# Patient Record
Sex: Male | Born: 1955 | Race: Black or African American | Hispanic: No | Marital: Married | Smoking: Current every day smoker
Health system: Southern US, Community
[De-identification: ages and names within clinical notes are randomized; demographics above are authoritative.]

## PROBLEM LIST (undated history)

## (undated) DIAGNOSIS — E119 Type 2 diabetes mellitus without complications: Secondary | ICD-10-CM

## (undated) DIAGNOSIS — I10 Essential (primary) hypertension: Secondary | ICD-10-CM

## (undated) DIAGNOSIS — I2510 Atherosclerotic heart disease of native coronary artery without angina pectoris: Secondary | ICD-10-CM

## (undated) DIAGNOSIS — E669 Obesity, unspecified: Secondary | ICD-10-CM

## (undated) DIAGNOSIS — G4730 Sleep apnea, unspecified: Secondary | ICD-10-CM

## (undated) HISTORY — DX: Obesity, unspecified: E66.9

## (undated) HISTORY — DX: Atherosclerotic heart disease of native coronary artery without angina pectoris: I25.10

## (undated) HISTORY — DX: Sleep apnea, unspecified: G47.30

---

## 2000-06-04 ENCOUNTER — Encounter: Payer: Self-pay | Admitting: Family Medicine

## 2000-06-04 ENCOUNTER — Ambulatory Visit (HOSPITAL_COMMUNITY): Admission: RE | Admit: 2000-06-04 | Discharge: 2000-06-04 | Payer: Self-pay | Admitting: Family Medicine

## 2005-10-30 ENCOUNTER — Ambulatory Visit: Payer: Self-pay | Admitting: Gastroenterology

## 2008-07-15 ENCOUNTER — Emergency Department (HOSPITAL_COMMUNITY): Admission: EM | Admit: 2008-07-15 | Discharge: 2008-07-15 | Payer: Self-pay | Admitting: Emergency Medicine

## 2008-08-27 HISTORY — PX: CORONARY ARTERY BYPASS GRAFT: SHX141

## 2008-11-07 ENCOUNTER — Emergency Department (HOSPITAL_COMMUNITY): Admission: EM | Admit: 2008-11-07 | Discharge: 2008-11-07 | Payer: Self-pay | Admitting: Emergency Medicine

## 2009-03-29 ENCOUNTER — Encounter (INDEPENDENT_AMBULATORY_CARE_PROVIDER_SITE_OTHER): Payer: Self-pay | Admitting: Cardiovascular Disease

## 2009-03-29 ENCOUNTER — Ambulatory Visit (HOSPITAL_COMMUNITY): Admission: RE | Admit: 2009-03-29 | Discharge: 2009-03-29 | Payer: Self-pay | Admitting: Cardiovascular Disease

## 2009-03-29 ENCOUNTER — Ambulatory Visit: Payer: Self-pay | Admitting: Surgery

## 2009-03-30 ENCOUNTER — Ambulatory Visit: Payer: Self-pay | Admitting: Cardiothoracic Surgery

## 2009-04-05 ENCOUNTER — Inpatient Hospital Stay (HOSPITAL_COMMUNITY): Admission: RE | Admit: 2009-04-05 | Discharge: 2009-04-10 | Payer: Self-pay | Admitting: Cardiothoracic Surgery

## 2009-04-05 ENCOUNTER — Ambulatory Visit: Payer: Self-pay | Admitting: Cardiothoracic Surgery

## 2009-04-13 ENCOUNTER — Encounter: Admission: RE | Admit: 2009-04-13 | Discharge: 2009-04-14 | Payer: Self-pay | Admitting: Cardiothoracic Surgery

## 2009-04-28 ENCOUNTER — Ambulatory Visit: Payer: Self-pay | Admitting: Cardiothoracic Surgery

## 2009-04-28 ENCOUNTER — Encounter: Admission: RE | Admit: 2009-04-28 | Discharge: 2009-04-28 | Payer: Self-pay | Admitting: Cardiothoracic Surgery

## 2009-05-05 ENCOUNTER — Encounter (HOSPITAL_COMMUNITY): Admission: RE | Admit: 2009-05-05 | Discharge: 2009-08-03 | Payer: Self-pay | Admitting: Cardiovascular Disease

## 2010-01-06 IMAGING — CR DG CHEST 2V
2 series · 2 of 2 positions shown · non-contrast
Comparison: Chest radiograph performed 11/07/2008

CLINICAL DATA: Preoperative radiograph for CABG; left arm and chest
tightness.

CHEST - 2 VIEW

[view not recorded (1 of 2)]
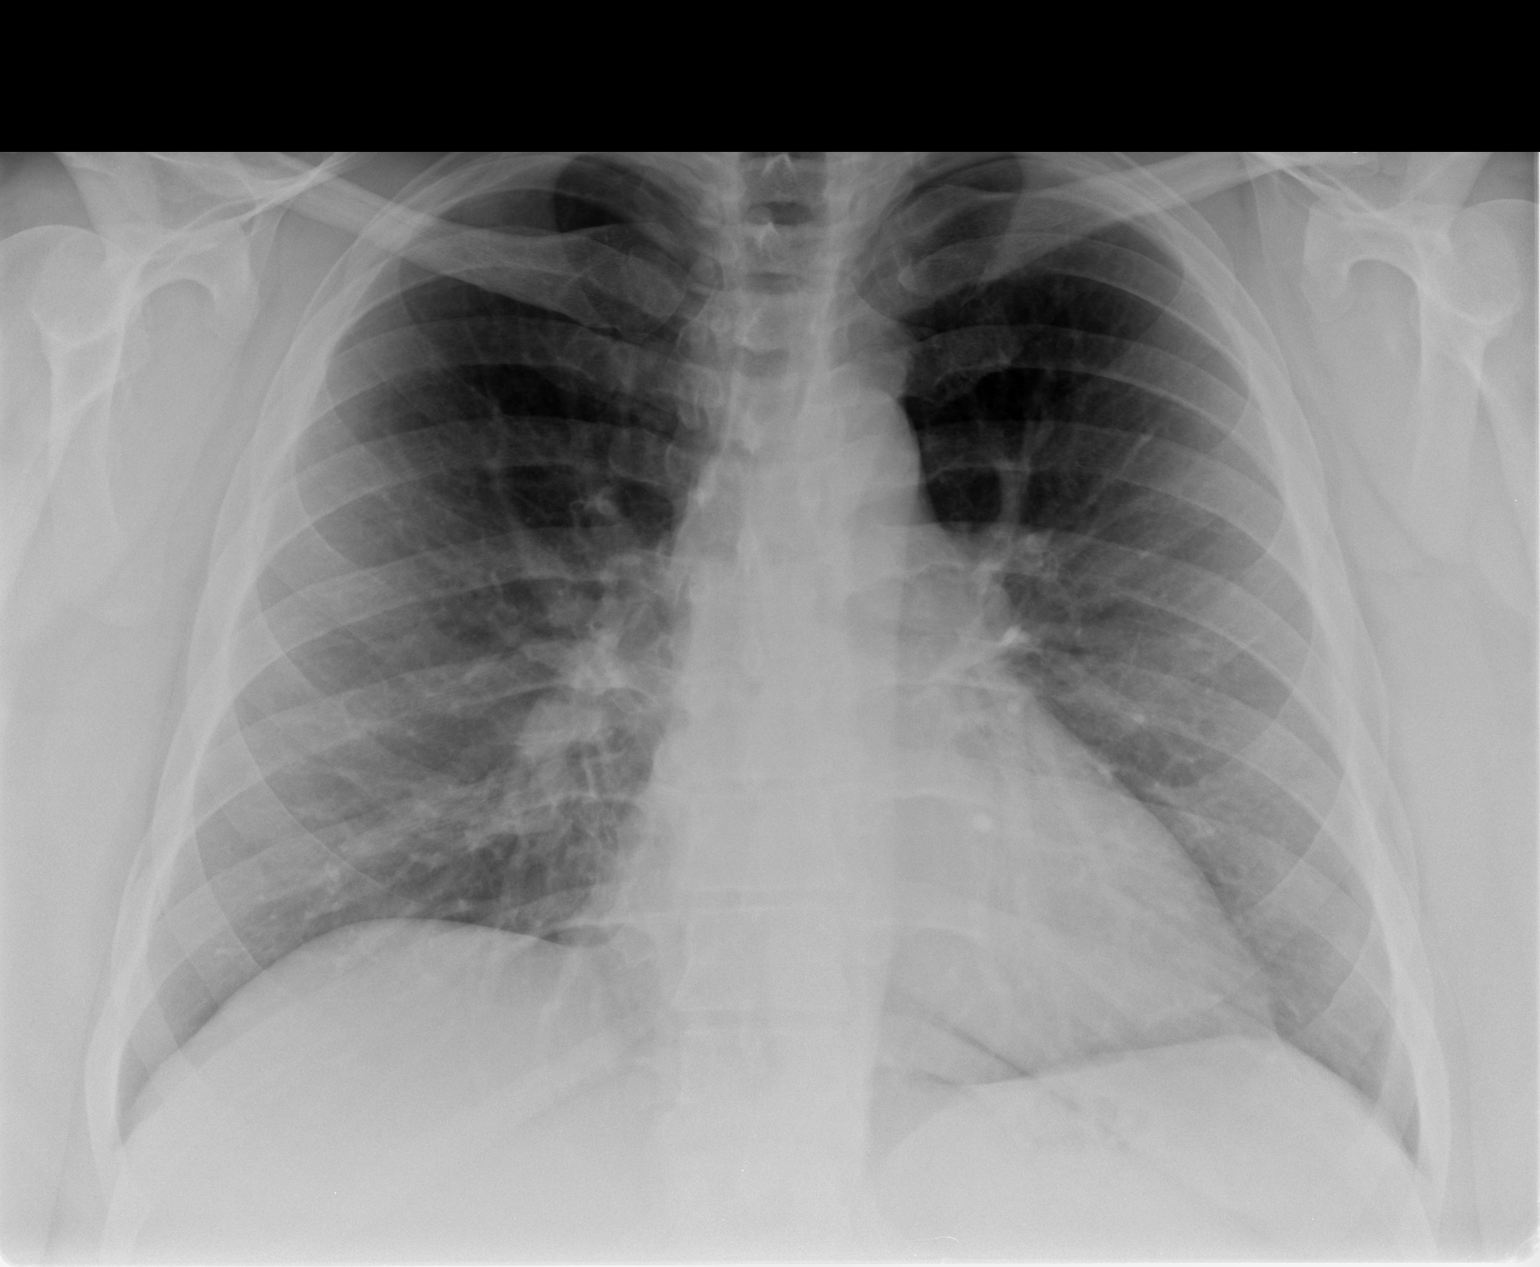

[view not recorded (2 of 2)]
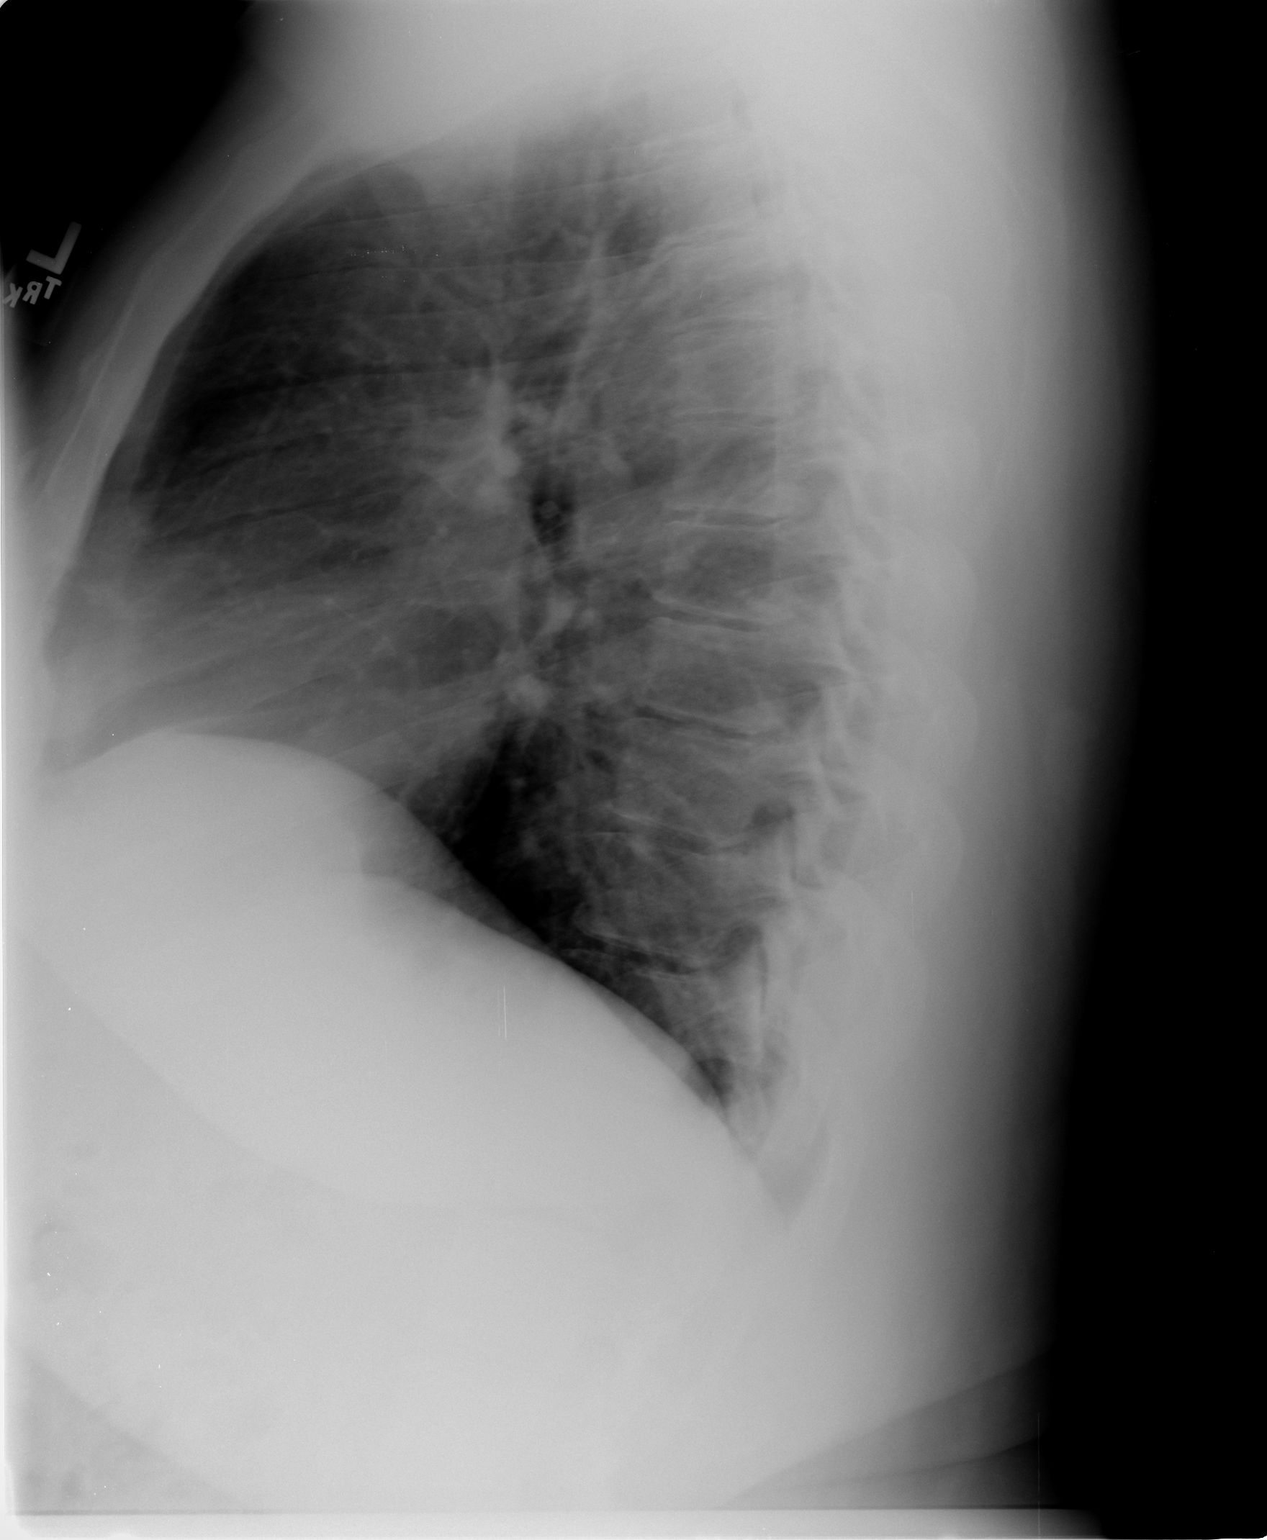

[2 of 2 positions shown; findings below may reference images not displayed]

FINDINGS: The lungs are well-aerated and clear.  There is no
evidence of focal opacification, pleural effusion or pneumothorax.

The heart is normal in size; the mediastinal contour is within
normal limits.  No acute osseous abnormalities are seen.
IMPRESSION: No acute cardiopulmonary process seen.

## 2010-02-02 IMAGING — CR DG CHEST 2V
2 series · 2 of 2 positions shown · non-contrast
Comparison: Chest x-ray of 04/08/2009

CLINICAL DATA: Chest pain, CABG in March 2009

CHEST - 2 VIEW

[w chest pa]
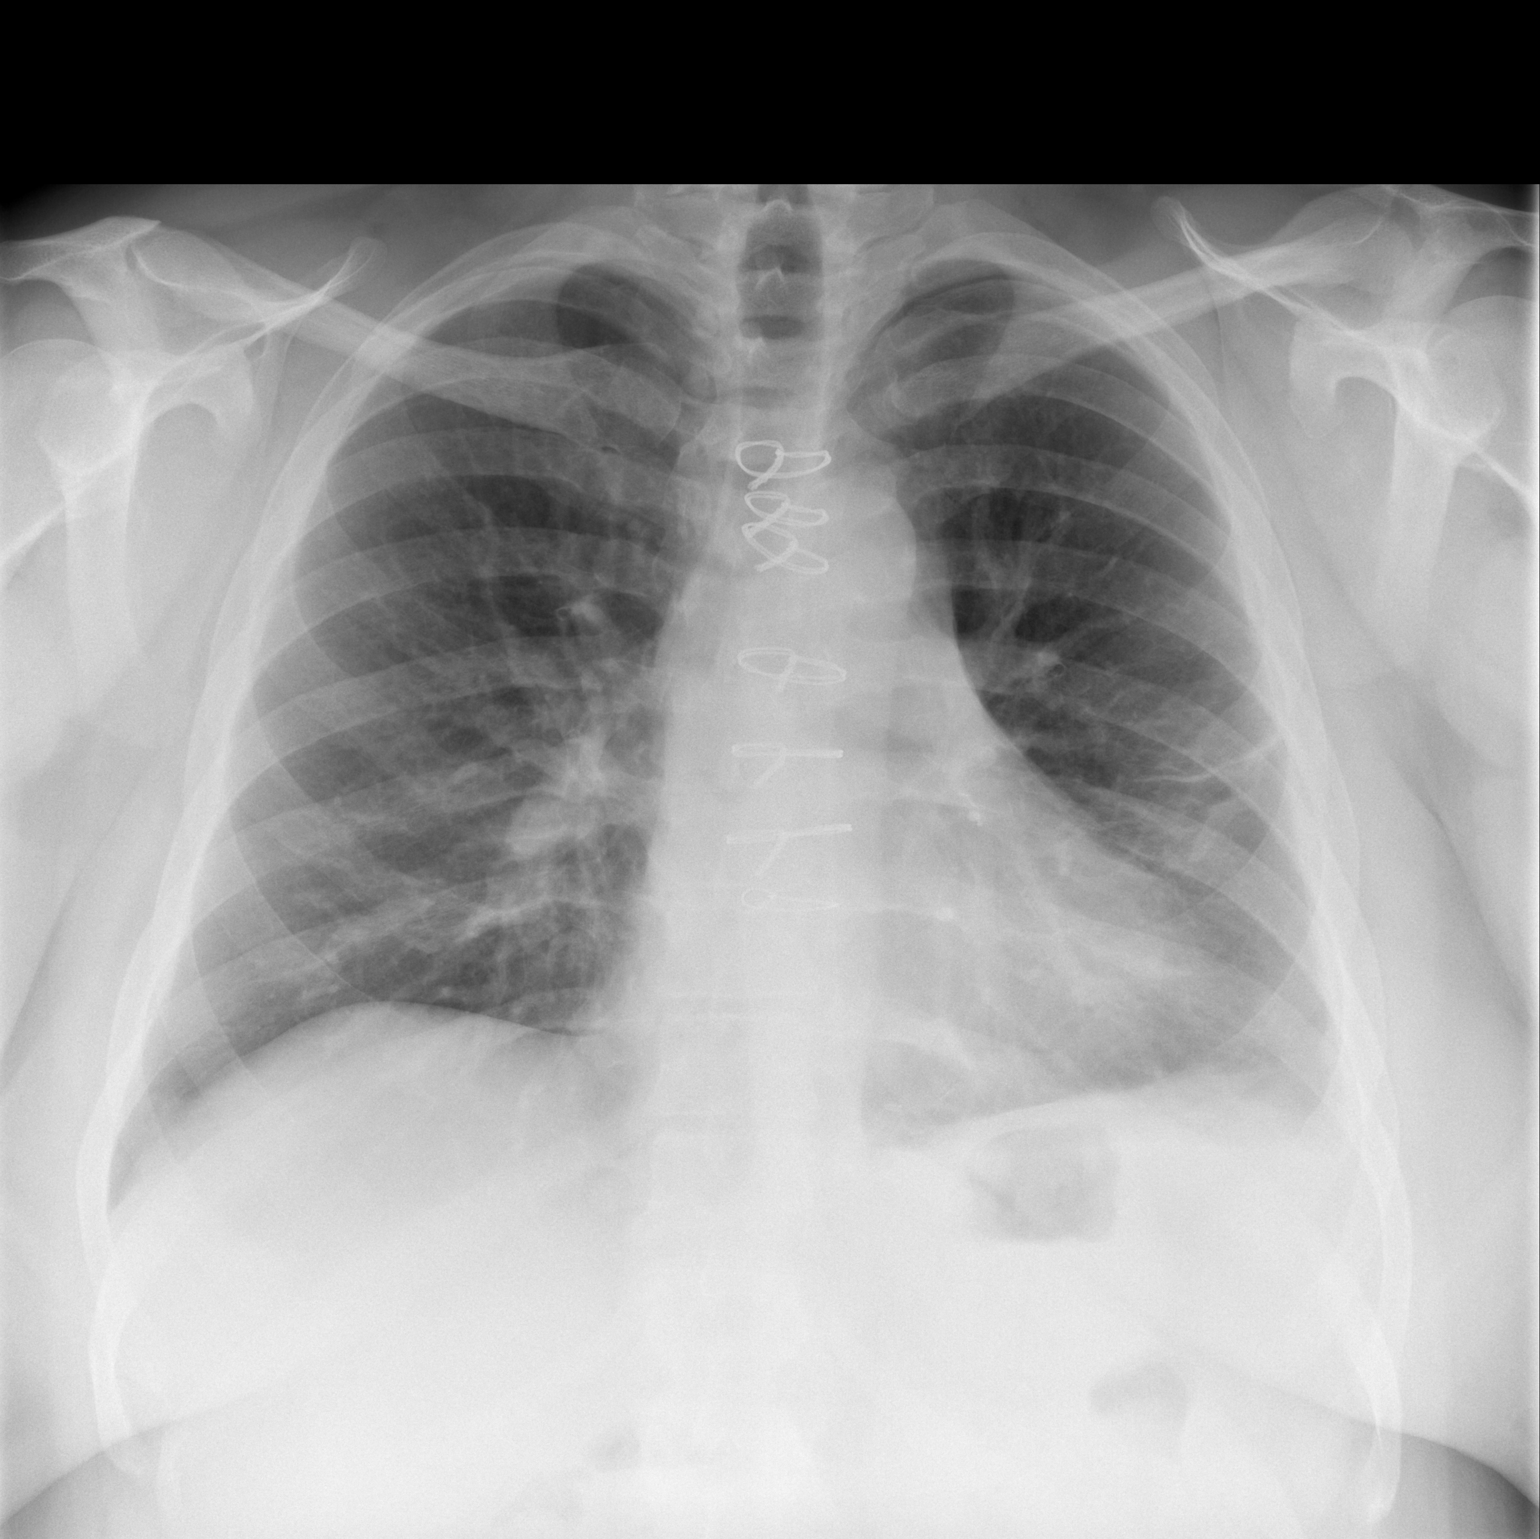

[w chest lat]
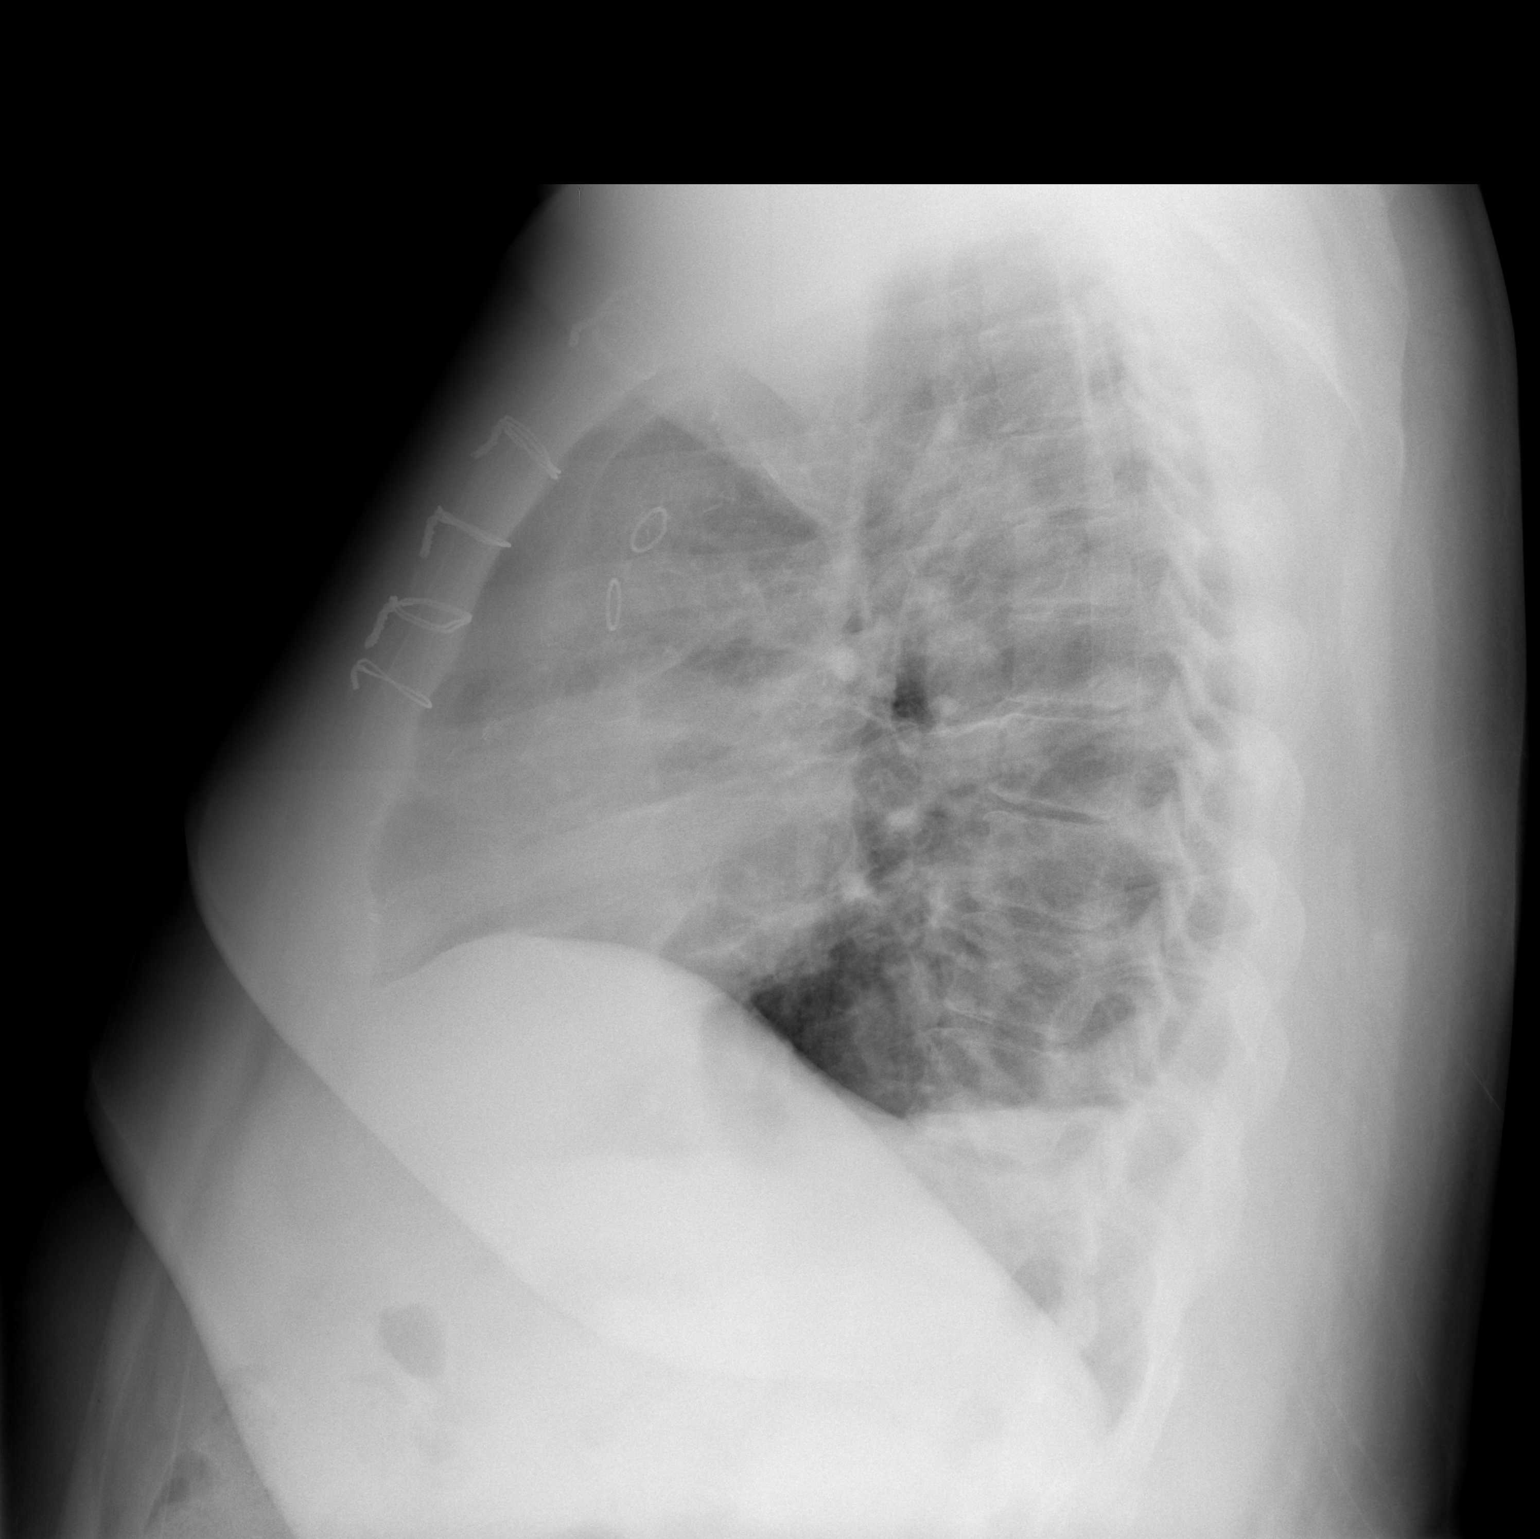

[2 of 2 positions shown; findings below may reference images not displayed]

FINDINGS: Aeration has improved with decrease in basilar
atelectasis.  A small left effusion remains.  Cardiomegaly is
stable.  No bony abnormality is seen.
IMPRESSION: Improved aeration.  Small left pleural effusion remains.

## 2010-12-01 LAB — GLUCOSE, CAPILLARY
Glucose-Capillary: 113 mg/dL — ABNORMAL HIGH (ref 70–99)
Glucose-Capillary: 72 mg/dL (ref 70–99)

## 2010-12-02 LAB — PROTIME-INR
INR: 1.1 (ref 0.00–1.49)
INR: 1.4 (ref 0.00–1.49)
Prothrombin Time: 14 seconds (ref 11.6–15.2)
Prothrombin Time: 16.8 seconds — ABNORMAL HIGH (ref 11.6–15.2)

## 2010-12-02 LAB — BASIC METABOLIC PANEL
BUN: 12 mg/dL (ref 6–23)
BUN: 20 mg/dL (ref 6–23)
BUN: 26 mg/dL — ABNORMAL HIGH (ref 6–23)
BUN: 27 mg/dL — ABNORMAL HIGH (ref 6–23)
BUN: 37 mg/dL — ABNORMAL HIGH (ref 6–23)
CO2: 25 mEq/L (ref 19–32)
CO2: 26 mEq/L (ref 19–32)
CO2: 26 mEq/L (ref 19–32)
CO2: 26 mEq/L (ref 19–32)
CO2: 27 mEq/L (ref 19–32)
Calcium: 7.9 mg/dL — ABNORMAL LOW (ref 8.4–10.5)
Calcium: 8.2 mg/dL — ABNORMAL LOW (ref 8.4–10.5)
Calcium: 8.6 mg/dL (ref 8.4–10.5)
Calcium: 8.6 mg/dL (ref 8.4–10.5)
Calcium: 8.7 mg/dL (ref 8.4–10.5)
Chloride: 100 mEq/L (ref 96–112)
Chloride: 105 mEq/L (ref 96–112)
Chloride: 98 mEq/L (ref 96–112)
Chloride: 98 mEq/L (ref 96–112)
Chloride: 98 mEq/L (ref 96–112)
Creatinine, Ser: 1.15 mg/dL (ref 0.4–1.5)
Creatinine, Ser: 1.17 mg/dL (ref 0.4–1.5)
Creatinine, Ser: 1.34 mg/dL (ref 0.4–1.5)
Creatinine, Ser: 1.5 mg/dL (ref 0.4–1.5)
Creatinine, Ser: 1.53 mg/dL — ABNORMAL HIGH (ref 0.4–1.5)
GFR calc Af Amer: 58 mL/min — ABNORMAL LOW (ref >60)
GFR calc Af Amer: 60 mL/min — ABNORMAL LOW (ref >60)
GFR calc Af Amer: >60 mL/min (ref >60)
GFR calc Af Amer: >60 mL/min (ref >60)
GFR calc Af Amer: >60 mL/min (ref >60)
GFR calc non Af Amer: 48 mL/min — ABNORMAL LOW (ref >60)
GFR calc non Af Amer: 49 mL/min — ABNORMAL LOW (ref >60)
GFR calc non Af Amer: 56 mL/min — ABNORMAL LOW (ref >60)
GFR calc non Af Amer: >60 mL/min (ref >60)
GFR calc non Af Amer: >60 mL/min (ref >60)
Glucose, Bld: 127 mg/dL — ABNORMAL HIGH (ref 70–99)
Glucose, Bld: 138 mg/dL — ABNORMAL HIGH (ref 70–99)
Glucose, Bld: 154 mg/dL — ABNORMAL HIGH (ref 70–99)
Glucose, Bld: 169 mg/dL — ABNORMAL HIGH (ref 70–99)
Glucose, Bld: 81 mg/dL (ref 70–99)
Potassium: 4.2 mEq/L (ref 3.5–5.1)
Potassium: 4.2 mEq/L (ref 3.5–5.1)
Potassium: 4.4 mEq/L (ref 3.5–5.1)
Potassium: 4.5 mEq/L (ref 3.5–5.1)
Potassium: 4.7 mEq/L (ref 3.5–5.1)
Sodium: 132 mEq/L — ABNORMAL LOW (ref 135–145)
Sodium: 132 mEq/L — ABNORMAL LOW (ref 135–145)
Sodium: 133 mEq/L — ABNORMAL LOW (ref 135–145)
Sodium: 133 mEq/L — ABNORMAL LOW (ref 135–145)
Sodium: 136 mEq/L (ref 135–145)

## 2010-12-02 LAB — GLUCOSE, CAPILLARY
Glucose-Capillary: 102 mg/dL — ABNORMAL HIGH (ref 70–99)
Glucose-Capillary: 102 mg/dL — ABNORMAL HIGH (ref 70–99)
Glucose-Capillary: 104 mg/dL — ABNORMAL HIGH (ref 70–99)
Glucose-Capillary: 106 mg/dL — ABNORMAL HIGH (ref 70–99)
Glucose-Capillary: 118 mg/dL — ABNORMAL HIGH (ref 70–99)
Glucose-Capillary: 119 mg/dL — ABNORMAL HIGH (ref 70–99)
Glucose-Capillary: 122 mg/dL — ABNORMAL HIGH (ref 70–99)
Glucose-Capillary: 125 mg/dL — ABNORMAL HIGH (ref 70–99)
Glucose-Capillary: 136 mg/dL — ABNORMAL HIGH (ref 70–99)
Glucose-Capillary: 137 mg/dL — ABNORMAL HIGH (ref 70–99)
Glucose-Capillary: 143 mg/dL — ABNORMAL HIGH (ref 70–99)
Glucose-Capillary: 143 mg/dL — ABNORMAL HIGH (ref 70–99)
Glucose-Capillary: 147 mg/dL — ABNORMAL HIGH (ref 70–99)
Glucose-Capillary: 150 mg/dL — ABNORMAL HIGH (ref 70–99)
Glucose-Capillary: 156 mg/dL — ABNORMAL HIGH (ref 70–99)
Glucose-Capillary: 157 mg/dL — ABNORMAL HIGH (ref 70–99)
Glucose-Capillary: 157 mg/dL — ABNORMAL HIGH (ref 70–99)
Glucose-Capillary: 158 mg/dL — ABNORMAL HIGH (ref 70–99)
Glucose-Capillary: 162 mg/dL — ABNORMAL HIGH (ref 70–99)
Glucose-Capillary: 169 mg/dL — ABNORMAL HIGH (ref 70–99)
Glucose-Capillary: 195 mg/dL — ABNORMAL HIGH (ref 70–99)
Glucose-Capillary: 219 mg/dL — ABNORMAL HIGH (ref 70–99)
Glucose-Capillary: 82 mg/dL (ref 70–99)
Glucose-Capillary: 90 mg/dL (ref 70–99)
Glucose-Capillary: 91 mg/dL (ref 70–99)
Glucose-Capillary: 91 mg/dL (ref 70–99)

## 2010-12-02 LAB — CBC
HCT: 24.7 % — ABNORMAL LOW (ref 39.0–52.0)
HCT: 25.1 % — ABNORMAL LOW (ref 39.0–52.0)
HCT: 26.4 % — ABNORMAL LOW (ref 39.0–52.0)
HCT: 27.4 % — ABNORMAL LOW (ref 39.0–52.0)
HCT: 28.9 % — ABNORMAL LOW (ref 39.0–52.0)
HCT: 28.9 % — ABNORMAL LOW (ref 39.0–52.0)
HCT: 32.8 % — ABNORMAL LOW (ref 39.0–52.0)
HCT: 34.3 % — ABNORMAL LOW (ref 39.0–52.0)
HCT: 37.8 % — ABNORMAL LOW (ref 39.0–52.0)
Hemoglobin: 10.6 g/dL — ABNORMAL LOW (ref 13.0–17.0)
Hemoglobin: 11.1 g/dL — ABNORMAL LOW (ref 13.0–17.0)
Hemoglobin: 12.4 g/dL — ABNORMAL LOW (ref 13.0–17.0)
Hemoglobin: 8 g/dL — ABNORMAL LOW (ref 13.0–17.0)
Hemoglobin: 8.4 g/dL — ABNORMAL LOW (ref 13.0–17.0)
Hemoglobin: 9 g/dL — ABNORMAL LOW (ref 13.0–17.0)
Hemoglobin: 9.1 g/dL — ABNORMAL LOW (ref 13.0–17.0)
Hemoglobin: 9.4 g/dL — ABNORMAL LOW (ref 13.0–17.0)
Hemoglobin: 9.5 g/dL — ABNORMAL LOW (ref 13.0–17.0)
MCHC: 32.2 g/dL (ref 30.0–36.0)
MCHC: 32.3 g/dL (ref 30.0–36.0)
MCHC: 32.5 g/dL (ref 30.0–36.0)
MCHC: 32.5 g/dL (ref 30.0–36.0)
MCHC: 32.8 g/dL (ref 30.0–36.0)
MCHC: 32.8 g/dL (ref 30.0–36.0)
MCHC: 33.2 g/dL (ref 30.0–36.0)
MCHC: 33.3 g/dL (ref 30.0–36.0)
MCHC: 33.9 g/dL (ref 30.0–36.0)
MCV: 78.8 fL (ref 78.0–100.0)
MCV: 79.5 fL (ref 78.0–100.0)
MCV: 80.2 fL (ref 78.0–100.0)
MCV: 80.3 fL (ref 78.0–100.0)
MCV: 80.3 fL (ref 78.0–100.0)
MCV: 80.3 fL (ref 78.0–100.0)
MCV: 80.6 fL (ref 78.0–100.0)
MCV: 80.8 fL (ref 78.0–100.0)
MCV: 80.9 fL (ref 78.0–100.0)
Platelets: 260 10*3/uL (ref 150–400)
Platelets: 264 10*3/uL (ref 150–400)
Platelets: 298 10*3/uL (ref 150–400)
Platelets: 304 10*3/uL (ref 150–400)
Platelets: 308 10*3/uL (ref 150–400)
Platelets: 319 10*3/uL (ref 150–400)
Platelets: 333 10*3/uL (ref 150–400)
Platelets: 433 10*3/uL — ABNORMAL HIGH (ref 150–400)
Platelets: 438 10*3/uL — ABNORMAL HIGH (ref 150–400)
RBC: 3.05 MIL/uL — ABNORMAL LOW (ref 4.22–5.81)
RBC: 3.13 MIL/uL — ABNORMAL LOW (ref 4.22–5.81)
RBC: 3.35 MIL/uL — ABNORMAL LOW (ref 4.22–5.81)
RBC: 3.45 MIL/uL — ABNORMAL LOW (ref 4.22–5.81)
RBC: 3.58 MIL/uL — ABNORMAL LOW (ref 4.22–5.81)
RBC: 3.6 MIL/uL — ABNORMAL LOW (ref 4.22–5.81)
RBC: 4.08 MIL/uL — ABNORMAL LOW (ref 4.22–5.81)
RBC: 4.28 MIL/uL (ref 4.22–5.81)
RBC: 4.69 MIL/uL (ref 4.22–5.81)
RDW: 16.1 % — ABNORMAL HIGH (ref 11.5–15.5)
RDW: 16.4 % — ABNORMAL HIGH (ref 11.5–15.5)
RDW: 16.4 % — ABNORMAL HIGH (ref 11.5–15.5)
RDW: 16.4 % — ABNORMAL HIGH (ref 11.5–15.5)
RDW: 16.5 % — ABNORMAL HIGH (ref 11.5–15.5)
RDW: 16.6 % — ABNORMAL HIGH (ref 11.5–15.5)
RDW: 16.7 % — ABNORMAL HIGH (ref 11.5–15.5)
RDW: 16.8 % — ABNORMAL HIGH (ref 11.5–15.5)
RDW: 16.9 % — ABNORMAL HIGH (ref 11.5–15.5)
WBC: 14.8 10*3/uL — ABNORMAL HIGH (ref 4.0–10.5)
WBC: 15.7 10*3/uL — ABNORMAL HIGH (ref 4.0–10.5)
WBC: 16.1 10*3/uL — ABNORMAL HIGH (ref 4.0–10.5)
WBC: 16.2 10*3/uL — ABNORMAL HIGH (ref 4.0–10.5)
WBC: 17.1 10*3/uL — ABNORMAL HIGH (ref 4.0–10.5)
WBC: 18.6 10*3/uL — ABNORMAL HIGH (ref 4.0–10.5)
WBC: 19.9 10*3/uL — ABNORMAL HIGH (ref 4.0–10.5)
WBC: 21.2 10*3/uL — ABNORMAL HIGH (ref 4.0–10.5)
WBC: 9.8 10*3/uL (ref 4.0–10.5)

## 2010-12-02 LAB — POCT I-STAT 3, ART BLOOD GAS (G3+)
Acid-Base Excess: 1 mmol/L (ref 0.0–2.0)
Acid-Base Excess: 3 mmol/L — ABNORMAL HIGH (ref 0.0–2.0)
Acid-Base Excess: 4 mmol/L — ABNORMAL HIGH (ref 0.0–2.0)
Acid-Base Excess: 4 mmol/L — ABNORMAL HIGH (ref 0.0–2.0)
Acid-Base Excess: 5 mmol/L — ABNORMAL HIGH (ref 0.0–2.0)
Acid-base deficit: 1 mmol/L (ref 0.0–2.0)
Bicarbonate: 25.1 mEq/L — ABNORMAL HIGH (ref 20.0–24.0)
Bicarbonate: 26.8 mEq/L — ABNORMAL HIGH (ref 20.0–24.0)
Bicarbonate: 28.5 mEq/L — ABNORMAL HIGH (ref 20.0–24.0)
Bicarbonate: 29.5 mEq/L — ABNORMAL HIGH (ref 20.0–24.0)
Bicarbonate: 29.5 mEq/L — ABNORMAL HIGH (ref 20.0–24.0)
Bicarbonate: 29.7 mEq/L — ABNORMAL HIGH (ref 20.0–24.0)
O2 Saturation: 100 %
O2 Saturation: 100 %
O2 Saturation: 100 %
O2 Saturation: 100 %
O2 Saturation: 91 %
O2 Saturation: 95 %
Patient temperature: 36.1
Patient temperature: 37.5
TCO2: 26 mmol/L (ref 0–100)
TCO2: 28 mmol/L (ref 0–100)
TCO2: 30 mmol/L (ref 0–100)
TCO2: 31 mmol/L (ref 0–100)
TCO2: 31 mmol/L (ref 0–100)
TCO2: 31 mmol/L (ref 0–100)
pCO2 arterial: 43 mmHg (ref 35.0–45.0)
pCO2 arterial: 43.5 mmHg (ref 35.0–45.0)
pCO2 arterial: 45.2 mmHg — ABNORMAL HIGH (ref 35.0–45.0)
pCO2 arterial: 46.2 mmHg — ABNORMAL HIGH (ref 35.0–45.0)
pCO2 arterial: 47 mmHg — ABNORMAL HIGH (ref 35.0–45.0)
pCO2 arterial: 47.6 mmHg — ABNORMAL HIGH (ref 35.0–45.0)
pH, Arterial: 7.354 (ref 7.350–7.450)
pH, Arterial: 7.391 (ref 7.350–7.450)
pH, Arterial: 7.399 (ref 7.350–7.450)
pH, Arterial: 7.4 (ref 7.350–7.450)
pH, Arterial: 7.413 (ref 7.350–7.450)
pH, Arterial: 7.441 (ref 7.350–7.450)
pO2, Arterial: 202 mmHg — ABNORMAL HIGH (ref 80.0–100.0)
pO2, Arterial: 272 mmHg — ABNORMAL HIGH (ref 80.0–100.0)
pO2, Arterial: 286 mmHg — ABNORMAL HIGH (ref 80.0–100.0)
pO2, Arterial: 340 mmHg — ABNORMAL HIGH (ref 80.0–100.0)
pO2, Arterial: 67 mmHg — ABNORMAL LOW (ref 80.0–100.0)
pO2, Arterial: 72 mmHg — ABNORMAL LOW (ref 80.0–100.0)

## 2010-12-02 LAB — POCT I-STAT, CHEM 8
BUN: 10 mg/dL (ref 6–23)
BUN: 17 mg/dL (ref 6–23)
Calcium, Ion: 1.12 mmol/L (ref 1.12–1.32)
Calcium, Ion: 1.13 mmol/L (ref 1.12–1.32)
Chloride: 103 mEq/L (ref 96–112)
Chloride: 108 mEq/L (ref 96–112)
Creatinine, Ser: 0.9 mg/dL (ref 0.4–1.5)
Creatinine, Ser: 1.1 mg/dL (ref 0.4–1.5)
Glucose, Bld: 167 mg/dL — ABNORMAL HIGH (ref 70–99)
Glucose, Bld: 175 mg/dL — ABNORMAL HIGH (ref 70–99)
HCT: 29 % — ABNORMAL LOW (ref 39.0–52.0)
HCT: 37 % — ABNORMAL LOW (ref 39.0–52.0)
Hemoglobin: 12.6 g/dL — ABNORMAL LOW (ref 13.0–17.0)
Hemoglobin: 9.9 g/dL — ABNORMAL LOW (ref 13.0–17.0)
Potassium: 4.1 mEq/L (ref 3.5–5.1)
Potassium: 4.9 mEq/L (ref 3.5–5.1)
Sodium: 137 mEq/L (ref 135–145)
Sodium: 140 mEq/L (ref 135–145)
TCO2: 21 mmol/L (ref 0–100)
TCO2: 23 mmol/L (ref 0–100)

## 2010-12-02 LAB — POCT I-STAT 4, (NA,K, GLUC, HGB,HCT)
Glucose, Bld: 117 mg/dL — ABNORMAL HIGH (ref 70–99)
Glucose, Bld: 139 mg/dL — ABNORMAL HIGH (ref 70–99)
Glucose, Bld: 144 mg/dL — ABNORMAL HIGH (ref 70–99)
Glucose, Bld: 145 mg/dL — ABNORMAL HIGH (ref 70–99)
Glucose, Bld: 147 mg/dL — ABNORMAL HIGH (ref 70–99)
Glucose, Bld: 149 mg/dL — ABNORMAL HIGH (ref 70–99)
Glucose, Bld: 155 mg/dL — ABNORMAL HIGH (ref 70–99)
Glucose, Bld: 156 mg/dL — ABNORMAL HIGH (ref 70–99)
Glucose, Bld: 156 mg/dL — ABNORMAL HIGH (ref 70–99)
HCT: 29 % — ABNORMAL LOW (ref 39.0–52.0)
HCT: 30 % — ABNORMAL LOW (ref 39.0–52.0)
HCT: 30 % — ABNORMAL LOW (ref 39.0–52.0)
HCT: 30 % — ABNORMAL LOW (ref 39.0–52.0)
HCT: 31 % — ABNORMAL LOW (ref 39.0–52.0)
HCT: 31 % — ABNORMAL LOW (ref 39.0–52.0)
HCT: 36 % — ABNORMAL LOW (ref 39.0–52.0)
HCT: 37 % — ABNORMAL LOW (ref 39.0–52.0)
HCT: 39 % (ref 39.0–52.0)
Hemoglobin: 10.2 g/dL — ABNORMAL LOW (ref 13.0–17.0)
Hemoglobin: 10.2 g/dL — ABNORMAL LOW (ref 13.0–17.0)
Hemoglobin: 10.2 g/dL — ABNORMAL LOW (ref 13.0–17.0)
Hemoglobin: 10.5 g/dL — ABNORMAL LOW (ref 13.0–17.0)
Hemoglobin: 10.5 g/dL — ABNORMAL LOW (ref 13.0–17.0)
Hemoglobin: 12.2 g/dL — ABNORMAL LOW (ref 13.0–17.0)
Hemoglobin: 12.6 g/dL — ABNORMAL LOW (ref 13.0–17.0)
Hemoglobin: 13.3 g/dL (ref 13.0–17.0)
Hemoglobin: 9.9 g/dL — ABNORMAL LOW (ref 13.0–17.0)
Potassium: 3.4 mEq/L — ABNORMAL LOW (ref 3.5–5.1)
Potassium: 3.5 mEq/L (ref 3.5–5.1)
Potassium: 3.5 mEq/L (ref 3.5–5.1)
Potassium: 3.5 mEq/L (ref 3.5–5.1)
Potassium: 3.5 mEq/L (ref 3.5–5.1)
Potassium: 3.7 mEq/L (ref 3.5–5.1)
Potassium: 3.8 mEq/L (ref 3.5–5.1)
Potassium: 4.1 mEq/L (ref 3.5–5.1)
Potassium: 4.2 mEq/L (ref 3.5–5.1)
Sodium: 133 mEq/L — ABNORMAL LOW (ref 135–145)
Sodium: 136 mEq/L (ref 135–145)
Sodium: 136 mEq/L (ref 135–145)
Sodium: 137 mEq/L (ref 135–145)
Sodium: 138 mEq/L (ref 135–145)
Sodium: 138 mEq/L (ref 135–145)
Sodium: 139 mEq/L (ref 135–145)
Sodium: 140 mEq/L (ref 135–145)
Sodium: 143 mEq/L (ref 135–145)

## 2010-12-02 LAB — APTT
aPTT: 33 seconds (ref 24–37)
aPTT: 35 seconds (ref 24–37)

## 2010-12-02 LAB — TYPE AND SCREEN
ABO/RH(D): O POS
Antibody Screen: NEGATIVE

## 2010-12-02 LAB — URINALYSIS, ROUTINE W REFLEX MICROSCOPIC
Bilirubin Urine: NEGATIVE
Glucose, UA: NEGATIVE mg/dL
Hgb urine dipstick: NEGATIVE
Ketones, ur: NEGATIVE mg/dL
Nitrite: NEGATIVE
Protein, ur: NEGATIVE mg/dL
Specific Gravity, Urine: 1.011 (ref 1.005–1.030)
Urobilinogen, UA: 1 mg/dL (ref 0.0–1.0)
pH: 6.5 (ref 5.0–8.0)

## 2010-12-02 LAB — CREATININE, SERUM
Creatinine, Ser: 0.92 mg/dL (ref 0.4–1.5)
Creatinine, Ser: 1.21 mg/dL (ref 0.4–1.5)
GFR calc Af Amer: >60 mL/min (ref >60)
GFR calc Af Amer: >60 mL/min (ref >60)
GFR calc non Af Amer: >60 mL/min (ref >60)
GFR calc non Af Amer: >60 mL/min (ref >60)

## 2010-12-02 LAB — COMPREHENSIVE METABOLIC PANEL
ALT: 11 U/L (ref 0–53)
AST: 17 U/L (ref 0–37)
Albumin: 3.2 g/dL — ABNORMAL LOW (ref 3.5–5.2)
Alkaline Phosphatase: 85 U/L (ref 39–117)
BUN: 11 mg/dL (ref 6–23)
CO2: 31 mEq/L (ref 19–32)
Calcium: 9 mg/dL (ref 8.4–10.5)
Chloride: 100 mEq/L (ref 96–112)
Creatinine, Ser: 1.04 mg/dL (ref 0.4–1.5)
GFR calc Af Amer: >60 mL/min (ref >60)
GFR calc non Af Amer: >60 mL/min (ref >60)
Glucose, Bld: 145 mg/dL — ABNORMAL HIGH (ref 70–99)
Potassium: 3.5 mEq/L (ref 3.5–5.1)
Sodium: 139 mEq/L (ref 135–145)
Total Bilirubin: 1 mg/dL (ref 0.3–1.2)
Total Protein: 7.8 g/dL (ref 6.0–8.3)

## 2010-12-02 LAB — POCT I-STAT 3, VENOUS BLOOD GAS (G3P V)
Acid-Base Excess: 4 mmol/L — ABNORMAL HIGH (ref 0.0–2.0)
Bicarbonate: 29.4 mEq/L — ABNORMAL HIGH (ref 20.0–24.0)
O2 Saturation: 88 %
TCO2: 31 mmol/L (ref 0–100)
pCO2, Ven: 46.8 mmHg (ref 45.0–50.0)
pH, Ven: 7.406 — ABNORMAL HIGH (ref 7.250–7.300)
pO2, Ven: 56 mmHg — ABNORMAL HIGH (ref 30.0–45.0)

## 2010-12-02 LAB — MRSA CULTURE

## 2010-12-02 LAB — MAGNESIUM
Magnesium: 2.4 mg/dL (ref 1.5–2.5)
Magnesium: 2.5 mg/dL (ref 1.5–2.5)
Magnesium: 2.8 mg/dL — ABNORMAL HIGH (ref 1.5–2.5)

## 2010-12-02 LAB — BLOOD GAS, ARTERIAL
Acid-Base Excess: 4.9 mmol/L — ABNORMAL HIGH (ref 0.0–2.0)
Bicarbonate: 28.9 mEq/L — ABNORMAL HIGH (ref 20.0–24.0)
Drawn by: 287601
FIO2: 0.21 %
O2 Saturation: 95.2 %
Patient temperature: 98.6
TCO2: 30.2 mmol/L (ref 0–100)
pCO2 arterial: 42.8 mmHg (ref 35.0–45.0)
pH, Arterial: 7.444 (ref 7.350–7.450)
pO2, Arterial: 73 mmHg — ABNORMAL LOW (ref 80.0–100.0)

## 2010-12-02 LAB — ABO/RH: ABO/RH(D): O POS

## 2010-12-02 LAB — HEMOGLOBIN A1C
Hgb A1c MFr Bld: 7.8 % — ABNORMAL HIGH (ref 4.6–6.1)
Mean Plasma Glucose: 177 mg/dL

## 2010-12-02 LAB — MRSA PCR SCREENING: MRSA by PCR: INVALID — AB

## 2010-12-02 LAB — HEMOGLOBIN AND HEMATOCRIT, BLOOD
HCT: 27.4 % — ABNORMAL LOW (ref 39.0–52.0)
Hemoglobin: 9 g/dL — ABNORMAL LOW (ref 13.0–17.0)

## 2010-12-02 LAB — CK TOTAL AND CKMB (NOT AT ARMC)
CK, MB: 8.1 ng/mL — ABNORMAL HIGH (ref 0.3–4.0)
Relative Index: 1.7 (ref 0.0–2.5)
Total CK: 466 U/L — ABNORMAL HIGH (ref 7–232)

## 2010-12-02 LAB — POCT I-STAT GLUCOSE
Glucose, Bld: 151 mg/dL — ABNORMAL HIGH (ref 70–99)
Operator id: 173791

## 2010-12-02 LAB — PLATELET COUNT: Platelets: 325 10*3/uL (ref 150–400)

## 2010-12-07 LAB — POCT I-STAT, CHEM 8
Creatinine, Ser: 1.1 mg/dL (ref 0.4–1.5)
HCT: 42 % (ref 39.0–52.0)
Hemoglobin: 14.3 g/dL (ref 13.0–17.0)
Potassium: 4.3 mEq/L (ref 3.5–5.1)
Sodium: 141 mEq/L (ref 135–145)

## 2010-12-07 LAB — DIFFERENTIAL
Eosinophils Absolute: 0.1 10*3/uL (ref 0.0–0.7)
Lymphs Abs: 1.2 10*3/uL (ref 0.7–4.0)
Neutro Abs: 9.1 10*3/uL — ABNORMAL HIGH (ref 1.7–7.7)
Neutrophils Relative %: 84 % — ABNORMAL HIGH (ref 43–77)

## 2010-12-07 LAB — CBC
MCV: 80 fL (ref 78.0–100.0)
Platelets: 420 10*3/uL — ABNORMAL HIGH (ref 150–400)
WBC: 10.8 10*3/uL — ABNORMAL HIGH (ref 4.0–10.5)

## 2010-12-07 LAB — POCT CARDIAC MARKERS
CKMB, poc: <1 ng/mL — ABNORMAL LOW (ref 1.0–8.0)
Myoglobin, poc: 68.9 ng/mL (ref 12–200)

## 2010-12-07 LAB — CK TOTAL AND CKMB (NOT AT ARMC): CK, MB: 1 ng/mL (ref 0.3–4.0)

## 2011-01-09 NOTE — Assessment & Plan Note (Signed)
OFFICE VISIT   Oscar Day, Oscar Day  DOB:  07-24-1956                                        April 28, 2009  CHART #:  [id number]   The patient returns to the office today in followup after his coronary  artery bypass grafting x4 done on August 10.  He has been making  excellent progress postoperatively in spite of his new diagnosis of  diabetes and significant weight of 377 pounds.  He has increased his  physical activity appropriately.  He has refrained from smoking since he  stopped preoperatively.   On exam, his blood pressure 110/70, pulse is 91, respiratory rate is 18,  O2 sats 96%.  His sternum is stable and well healed.  His lungs are  clear.  His right vein harvest site is also healed well.  He has no  pedal edema.   Followup chest x-ray shows clear lung fields bilaterally.   He continues on Zegerid 40 mg a day.  We will resume his Diovan/HCT  320/12.5, Lipitor 40 mg a day.  When his Lopressor prescription is  complete, he will resume his Bystolic 5 mg a day.  Continues on Lasix  daily 40 mg a day, 1 aspirin a day 325 mg, glyburide 10 mg a day.  He  was placed on isosorbide dinitrate for his radial artery bypass for 30  days; when this prescription is complete, he will stop that.  I have  given him a prescription for Vicodin 7.5/500 thirty tablets 1 or 2 p.o.  q.4 h. p.r.n. for pain.  He has decreased the amount of pain medicine  that he has needed but still needs some at nighttime for relief.   Overall, I am very pleased with his progress.  I have encouraged him to  enroll on the cardiac rehab program.  We will have him healed. We  discussed returning to work on October 18, with  no lifting over 25 pounds.  He will continue to follow up with Dr. Allyson Sabal  and with Dr. Fleet Contras to manage his diabetes.   Sheliah Plane, MD  Electronically Signed   EG/MEDQ  D:  04/28/2009  T:  04/29/2009  Job:  [id number]   cc:   Nanetta Batty, M.D.  Fleet Contras, M.D.

## 2011-01-09 NOTE — Discharge Summary (Signed)
NAME:  Oscar Day, Oscar Day NO.:  [id number]   MEDICAL RECORD NO.:  [id number]          PATIENT TYPE:  INP   LOCATION:  2016                         FACILITY:  [facility]   PHYSICIAN:  Sheliah Plane, MD    DATE OF BIRTH:  [birth year]   DATE OF ADMISSION:  04/05/2009  DATE OF DISCHARGE:  04/10/2009                               DISCHARGE SUMMARY   HISTORY:  The patient is a 55 year old black male with severe obesity,  who presented with episodes of on and off for the past year of  substernal chest discomfort and left arm pain, sometimes at rest and  sometimes with exertion.  He denied any shortness of breath associated  with this.  He does have occasional nausea and occasional vomiting.  He  denies any diaphoresis associated with these episodes.  He has no  history of myocardial infarction.  A year ago, he had a stress test  which was low probability, however, because of the patient's continued  symptoms of recent episodes of discomfort while being set up for an  esophageal manometric NPH testing, he was referred to Dr. Allyson Sabal for  cardiac catheterization.  This was performed.  He was found to have an  ejection fraction of 50% with some inferior hypokinesis.  His right  coronary artery was totally occluded.  The right distal coronary artery  appeared to have a very small vessel.  He had a large LAD with a  proximal 80% lesion, a large ramus intermedius that supplied the lateral  wall of the heart that is a large vessel with an 80% lesion, and the  circumflex proper had an 80% lesion, and it comes out of the AV groove.  He was seen in thoracic surgical consultation by Sheliah Plane, MD,  who evaluated the patient and his studies, and due to the critical  anatomy, findings, and increasing symptoms, he was felt to require  surgical revascularization.  He was admitted this hospitalization for  the procedure.   PAST MEDICAL HISTORY:  1. Hypertension.  2. Hyperlipidemia.  3.  Long-term tobacco abuse of at least 20 years.  He quit 6 weeks ago.   PAST SURGICAL HISTORY:  He had a procedure in 1975 to repair a  undescended testicle.   SOCIAL HISTORY:  He is married, currently works as a Interior and spatial designer of  [employer].  He was previously a Runner, broadcasting/film/video in [place].  He denies alcohol use.   ALLERGIES:  No known allergies.   MEDICATIONS PRIOR TO ADMISSION:  1. Tums p.r.n.  2. Zegerid 40 mg twice daily.  3. Diovan/hydrochlorothiazide 320/12.5 mg daily.  4. Lipitor 40 mg daily.  5. Bystolic 5 mg daily.  6. Lasix unknown dose daily.  7. Aspirin 325 mg daily.   For review of symptoms and physical exam, please see Dr. Dennie Maizes  history and physical.   PROCEDURE:  On April 05, 2009, he was taken to the operating room where  he underwent coronary artery bypass grafting x4.  The following grafts  were placed:  1. Left internal mammary artery to  the LAD.  2. Saphenous vein graft to the intermediate #1 coronary artery.  3. Saphenous vein graft to the posterior descending.  4. A left radial artery to the intermediate #2 coronary artery.  He tolerated the procedure well, was taken to the Surgical Intensive  Care Unit in stable condition.   POSTOPERATIVE HOSPITAL COURSE:  The patient has done quite well.  He has  remained hemodynamically stable.  All routine lines, monitors, drainage,  devices have been continued in the standard fashion.  He was weaned from  the ventilator without difficulty.  He does have an acute blood loss  anemia.  We have been monitoring his hemoglobin/hematocrit closely and  his most recent values are 8.0 and 24.7 respectively on April 09, 2009.  He does a have mild renal insufficiency postoperatively as well.  Most  recent values show BUN 37 and creatinine 1.53.  He was noted  preoperatively to have a hemoglobin A1c of 7.8.  He has been started on  glipizide in the hospital and received diabetic training.  His incisions   are healing well without evidence of infection.  He is tolerating  activity commensurate for level of postoperative convalescence using  standard protocols.  Oxygen has been weaned.  He maintained good  saturations on room air.  His overall status is felt to be tentatively  stable for discharge in the morning of April 10, 2009, pending morning  round reevaluation.   MEDICATIONS ON DISCHARGE:  At the time of this dictation are as follows:  1. Aspirin 325 mg daily.  2. Lasix 40 mg daily.  3. Glipizide XL 10 mg daily.  4. Isosorbide mononitrate 30 mg daily for 1 month for radial artery      grafting.  5. Metoprolol 12.5 mg b.i.d.  6. Oxycodone 5 mg 1-2 q.4-6 h as needed for pain.  7. Potassium chloride 20 mEq daily.  8. Lipitor 40 mg daily.   Note:  The Lasix and potassium prescriptions are for an additional 5  days of treatment post discharge.   INSTRUCTIONS:  The patient received written instructions regarding  medications, activity, diet, wound care, and followup.  Followup include  appointments to see Dr. Allyson Sabal and Dr. Tyrone Sage.   FINAL DIAGNOSIS:  Severe coronary artery disease now status post  surgical revascularization as described.   OTHER DIAGNOSES:  Include:  1. Hypertension.  2. Hyperlipidemia.  3. Diabetes mellitus type 2.  4. History of tobacco abuse.  5. Postoperative acute renal insufficiency.  6. Postoperative acute blood loss anemia.      Rowe Clack, P.A.-C.      Sheliah Plane, MD  Electronically Signed    WEG/MEDQ  D:  04/09/2009  T:  04/09/2009  Job:  [id number]   cc:   Nanetta Batty, M.D.  Fleet Contras, M.D.  Jordan Hawks Elnoria Howard, MD

## 2011-01-09 NOTE — H&P (Signed)
NAME:  Oscar Day, DURNIL NO.:  [id number]   MEDICAL RECORD NO.:  [id number]          PATIENT TYPE:  OIB   LOCATION:  2899                         FACILITY:  [facility]   PHYSICIAN:  Sheliah Plane, MD    DATE OF BIRTH:  February 06, 1956   DATE OF ADMISSION:  03/29/2009  DATE OF DISCHARGE:  03/29/2009                              HISTORY & PHYSICAL   REQUESTING PHYSICIAN:  Nanetta Batty, MD   FOLLOWUP CARDIOLOGIST:  Nanetta Batty, MD   PRIMARY CARE PHYSICIAN:  Fleet Contras, MD, [place], in [place].   GI:  Jordan Hawks. Elnoria Howard, MD   REASON FOR CONSULTATION:  Coronary occlusive disease on recent cardiac  catheterization.   HISTORY OF PRESENT ILLNESS:  The patient is a 55 year old black male  with severe obesity who presents with episodes of on and off for the  past year of substernal discomfort and left arm pain, sometimes at rest,  sometimes with exertion.  He denies any shortness of breath associated  with it.  Does have some nausea occasionally vomiting.  He denies any  diaphoresis.  He has had no definite history of myocardial infarction.  A year ago, stress test was a low probability, however, because of the  patient's continued symptoms and a recent episode of discomfort while  being set up for an esophageal manometric and pH testing, he was  referred back to Dr. Allyson Sabal and a cardiac catheterization was done  yesterday.  The patient has had no previous history of myocardial  infarction, no previous cardiac catheterization until yesterday.  He  does have a history of hypertension, history of hyperlipidemia, has been  on Lipitor for the past 3 years.  Denies diabetes.  He is a long-term  smoker, at least 20 years, quit 6 weeks ago.  He had no previous stroke.  Denies claudication.  Denies renal insufficiency.  Has no other medical  problems.   PAST SURGICAL HISTORY:  In 1975, had surgery for undescended testicle.   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  Previously, as Runner, broadcasting/film/video in [place].  Denies alcohol use.   CURRENT MEDICATIONS:  1. Tums p.r.n.  2. Zegerid 40 mg twice a day.  3. Diovan/HCTZ 320/12.5 once a day.  4. Lipitor 40 mg a day.  5. Bystolic 5 mg a day.  6. Lasix unknown dose daily.  7. Aspirin 325 mg a day.   ALLERGIES:  He has no known allergies.   CARDIAC REVIEW OF SYSTEMS:  Positive for chest pain.  Denies resting  shortness of breath.  Denies exertional shortness of breath.  Denies  palpitations, syncope, presyncope, or orthopnea.  Does have mild edema.   GENERAL REVIEW OF SYSTEMS:  Denies fever, chills, or night sweats.  Has  no other constitutional symptoms.  Denies amaurosis or TIAs.  Denies  hemoptysis or wheezing.  He does not get flu shots and has refused and  has had colonoscopy in the past, but does not remember when.  Denies any  psychiatric  history.  Denies seizures or vertigo.   PHYSICAL EXAMINATION:  VITAL SIGNS:  The patient's blood pressure is  130/76, pulse is 63, respiratory rate is 18, and O2 sats 95%.  He is 6  feet 3 inches tall, 377 pounds.  His BMI is 34.5.  NECK:  The patient has no carotid bruits.  LUNGS:  Clear bilaterally.  CARDIAC:  Regular rate and rhythm without murmur or gallop.  ABDOMINAL:  Very obese abdomen without palpable mass or tenderness.  LOWER EXTREMITIES:  With some slight venous stasis changes around the  ankles.  Appears to have adequate vein for bypass.  The patient is right-  handed.  Allen test on the left is negative.  On his preoperative  Doppler's, he had preserved palmar arch with radial compression.   Cardiac catheterization is reviewed.  The patient has a ejection  fraction approximately 50% with some inferior hypokinesis.  His right  coronary artery is totally occluded.  The distal right appears to be  fairly small vessel.  He has a large LAD with a proximal 80% lesion, a   large ramus intermedius that supplies a lateral wall in the heart that  is large vessel with 80% lesion and the circ proper has 80% lesion and  as it comes out of the AV groove, has several small branches that may  not be large enough for bypass.  With the patient's critical anatomy  involving total right high-grade LAD and ramus and circumflex branches,  I agree with recommendation for coronary artery bypass grafting.  The  risks and options were discussed with the patient and his wife and  mother in detail.  The risk of death, infection, stroke, myocardial  infarction, bleeding, blood transfusions, all reviewed and was  specifically noted as increased risk because of obesity and his previous  smoking history, though he was congratulated in not smoking for this  past 6 weeks.  We will plan to proceed with bypass surgery on April 05, 2009.  The patient is agreeable with this.      Sheliah Plane, MD  Electronically Signed     EG/MEDQ  D:  03/30/2009  T:  03/31/2009  Job:  [id number]   cc:   Nanetta Batty, M.D.  Fleet Contras, M.D.

## 2011-01-09 NOTE — Cardiovascular Report (Signed)
NAME:  SIMSCullin, Dishman NO.:  [id number]   MEDICAL RECORD NO.:  [id number]          PATIENT TYPE:  OIB   LOCATION:  2899                         FACILITY:  [facility]   PHYSICIAN:  Nanetta Batty, M.D.   DATE OF BIRTH:  August 28, 1955   DATE OF PROCEDURE:  03/29/2009  DATE OF DISCHARGE:                            CARDIAC CATHETERIZATION   Mr. Wilkie is a delightful 55 year old severely overweight, married  African American male, father of 3 children, who works as the Interior and spatial designer  of [employer].  I initially saw him back in August  2009.  His risk factors include tobacco abuse, hypertension,  hyperlipidemia, and strong family history.  He also has GERD and  symptoms compatible with obstructive sleep apnea.  He did have a Myoview  stress test that showed inferoapical scar without ischemia.  He  underwent attempted endoscopy by Dr. Elnoria Howard and during anesthesia prior to  passing the scope, had desaturation and dropping of his ST segments.  The procedure was aborted.  The patient was then reevaluated by myself,  and the decision was made to proceed with outpatient diagnostic coronary  arteriography to define his anatomy and rule out ischemic etiology.  Because of his size and body habitus, it was elected to proceed with  right radial access.   DESCRIPTION OF PROCEDURE:  The patient was brought to the second floor  [facility] in the postabsorptive state.  He was  premedicated with p.o. Valium.  His right radial artery and right groin  were prepped and shaved in usual sterile fashion.  Xylocaine 1% was used  for local anesthesia.  A 5-French Terumo sheath was inserted into the  right radial artery using back wall withdrawal technique.  The patient  received 4000 units of heparin intravenously and received standard  radial cocktail through the side-arm sheath.  Visipaque dye was used  for the entirety of the case.  Retrograde aortic, left ventricular,  and  pullback pressures were recorded.   HEMODYNAMICS:  1. Aortic systolic pressure 119, diastolic pressure 69.  2. Left ventricular systolic pressure 121, end-diastolic pressure 26.   SELECTIVE CORONARY ANGIOGRAPHY:  1. Left main normal.  2. LAD; LAD had a complex hypodense proximal stenosis.  The first      diagonal branch was large and bifurcating and arose in the proximal      LAD and coursed in a ramus distribution.  There was 80% segmental      proximal stenosis.  There was 80% stenosis in the superior branch      proximally as well.  The second diagonal branch was relatively      small and had 90% proximal stenosis.  3. Left circumflex; nondominant with 80% proximal stenosis to 80% mid      AV groove stenosis.  There were no marginal branches noted.  4. Right coronary artery; dominant vessel with total occlusion in the      midportion.  There was recanalization and bidirectional      collaterals.  5. Left ventriculography; RAO left ventriculogram was performed using  30 mL of Visipaque dye at 12 mL per second.  The overall LVEF was      estimated at approximately 50% without focal wall motion      abnormalities.   IMPRESSION:  Mr. Risdon has three-vessel disease with preserved left  ventricular function and symptoms compatible with exertional angina.  He  will need coronary artery bypass grafting for complete  revascularization.  The catheter was removed, and the radial sheath was removed, and  hemostasis was obtained with a Terumo TR Band.  The patient left the lab  in stable condition.  Dr. Fleet Contras is notified of these results.  [facility] will be notified later this morning once the office opens and will  see him today to decide timing of revascularization.      Nanetta Batty, M.D.  Electronically Signed     JB/MEDQ  D:  03/29/2009  T:  03/29/2009  Job:  [id number]   cc:   Second Floor [facility]  [facility]  Fleet Contras, M.D.

## 2011-01-09 NOTE — Op Note (Signed)
NAME:  Oscar Day, Oscar Day NO.:  [id number]   MEDICAL RECORD NO.:  [id number]          PATIENT TYPE:  INP   LOCATION:  2016                         FACILITY:  [facility]   PHYSICIAN:  Sheliah Plane, MD    DATE OF BIRTH:  [birth year]   DATE OF PROCEDURE:  04/05/2009  DATE OF DISCHARGE:  04/10/2009                               OPERATIVE REPORT   PREOPERATIVE DIAGNOSIS:  Coronary occlusive disease.   POSTOPERATIVE DIAGNOSIS:  Coronary occlusive disease.   SURGICAL PROCEDURES:  Coronary artery bypass grafting x4 with the left  internal mammary to the left anterior descending coronary artery, left  radial artery to the intermediate coronary artery, reverse saphenous  vein to the obtuse marginal coronary artery, reverse saphenous vein  graft to the posterior descending coronary artery with endovein  harvesting, right leg.   SURGEON:  Sheliah Plane, MD   FIRST ASSISTANT:  Rowe Clack, PA-C   BRIEF HISTORY:  The patient is a 55 year old male probably with new  diagnosis of diabetes and a weight of 377 pounds presents with  increasing anginal symptoms.  Cardiac catheterization was performed by  Dr. Allyson Sabal, which demonstrated significant 3-vessel disease with 80%  stenosis of the LAD, intermediate, and right coronary system with  preserved LV function.  Coronary artery bypass grafting was recommended,  the patient agreed, and signed informed consent.   DESCRIPTION OF PROCEDURE:  With Swan-Ganz and arterial line monitors in  place, the patient underwent general endotracheal anesthesia without  incident.  Skin of the chest and leg was prepped with Betadine and  draped in usual sterile manner.  Using the Guidant endovein harvesting  system, vein was harvested from the right thigh.  In addition, the left  arm had been prepped out and a curvilinear incision was made over the  volar aspect of the left forearm.  The palmar arch was intact by Doppler  flow.  The radial artery  was dissected, dividing the small side branches  with the Harmonic scalpel.  Proximally and distally, the vessel was  ligated, flushed with blood papaverine solution and was of good quality  and caliber.  Median sternotomy was performed, and the skin was then  closed with a running subcutaneous 2-0 Vicryl and 4-0 subcuticular skin  stitches.  Median sternotomy was performed.  Left internal mammary  artery was dissected down as pedicle graft and was of good quality and  caliber.  The pericardium was opened.  Overall, ventricular function was  preserved.  Because of the patient's large size, a 22-French arterial  cannula was placed.  The patient was systemically heparinized.  Ascending aorta was cannulated.  The right atrium was cannulated and  aortic root vent cardioplegia needle was introduced into the ascending  aorta.  The patient was placed on cardiopulmonary bypass 2.4 liters per  minute per meter square.  Sites of anastomosis were selected and  dissected at the epicardium.  The patient's body temperature was cooled  to 30 degrees.  Aortic cross-clamp was applied and 500 mL of cold blood  potassium cardioplegia was administered with diastolic arrest of  the  heart.  Myocardial septal temperature was monitored throughout the cross-  clamp period.  First, the posterior descending coronary artery was  identified and opened, admitted a 1.5-mm probe.  Using a running 7-0  Prolene, distal anastomosis was performed with second reverse saphenous  vein graft.  The heart was then elevated to the intermediate coronary  artery.  The left radial artery was anastomosed.  This vessel was 1.8 mm  in size and of good quality.  Using a running 8-0 Prolene, the distal  anastomosis was performed.  The first obtuse marginal was then opened,  and in a similar fashion, a second reverse saphenous vein graft was  anastomosed to the obtuse marginal.  The left anterior descending  coronary artery was then opened  between the mid and distal third,  admitted 1.5 mm in size.  Distally using a running 8-0 Prolene, left  internal mammary artery anastomosed to the left anterior descending  coronary artery.  With cross-clamp still in place, 2 punch aortotomies  were performed and each of the 2 vein grafts anastomosed to the  ascending aorta.  The hood of the radial artery graft was then brought  off the hood of the vein graft going to the obtuse marginal vessel.  Prior to complete closure of the aortotomies, the bulldog on mammary  artery was removed with prompt rise in myocardial septal temperature.  Heart was allowed to passively fill and de-aired the ventricle on the  ascending aorta.  Cross-clamp was removed with total cross-clamp time of  106 minutes.  The patient converted to a sinus rhythm spontaneously.  Sites of anastomosis were inspected and free of bleeding, it was then  ventilated and weaned from cardiopulmonary bypass without difficulty  remaining hemodynamically stable.  He was decannulated in usual fashion.  Protamine sulfate was administered with operative field hemostatic.  Two  atrial and two ventricular pacing wires were applied.  Graft markers  were applied.  Left pleural tube, two mediastinal tubes left in place.  Sternum was closed with #6 stainless steel wire.  Fascia closed with  interrupted 0 Vicryl, running 3-0 Vicryl in subcutaneous tissue, 4-0  subcuticular stitch in skin edges.  Dry dressings were applied.  Sponge  and needle count was reported as correct at the completion of procedure.  The patient tolerated the procedure without obvious complication and was  transferred to Surgical Intensive Care Unit for further postoperative  care.      Sheliah Plane, MD  Electronically Signed     EG/MEDQ  D:  04/19/2009  T:  04/20/2009  Job:  [id number]   cc:   Nanetta Batty, M.D.

## 2016-12-09 ENCOUNTER — Emergency Department (HOSPITAL_COMMUNITY)
Admission: EM | Admit: 2016-12-09 | Discharge: 2016-12-09 | Disposition: A | Discharge status: Home / Self Care | Payer: BLUE CROSS/BLUE SHIELD | Attending: Emergency Medicine | Admitting: Emergency Medicine

## 2016-12-09 ENCOUNTER — Emergency Department (HOSPITAL_COMMUNITY): Payer: BLUE CROSS/BLUE SHIELD

## 2016-12-09 ENCOUNTER — Encounter (HOSPITAL_COMMUNITY): Payer: Self-pay

## 2016-12-09 DIAGNOSIS — F17200 Nicotine dependence, unspecified, uncomplicated: Secondary | ICD-10-CM | POA: Insufficient documentation

## 2016-12-09 DIAGNOSIS — E119 Type 2 diabetes mellitus without complications: Secondary | ICD-10-CM | POA: Insufficient documentation

## 2016-12-09 DIAGNOSIS — Y999 Unspecified external cause status: Secondary | ICD-10-CM | POA: Diagnosis not present

## 2016-12-09 DIAGNOSIS — W19XXXA Unspecified fall, initial encounter: Secondary | ICD-10-CM

## 2016-12-09 DIAGNOSIS — Y9289 Other specified places as the place of occurrence of the external cause: Secondary | ICD-10-CM | POA: Diagnosis not present

## 2016-12-09 DIAGNOSIS — I10 Essential (primary) hypertension: Secondary | ICD-10-CM | POA: Insufficient documentation

## 2016-12-09 DIAGNOSIS — S8991XA Unspecified injury of right lower leg, initial encounter: Secondary | ICD-10-CM | POA: Diagnosis present

## 2016-12-09 DIAGNOSIS — X509XXA Other and unspecified overexertion or strenuous movements or postures, initial encounter: Secondary | ICD-10-CM | POA: Insufficient documentation

## 2016-12-09 DIAGNOSIS — S838X1A Sprain of other specified parts of right knee, initial encounter: Secondary | ICD-10-CM

## 2016-12-09 DIAGNOSIS — S93491A Sprain of other ligament of right ankle, initial encounter: Secondary | ICD-10-CM | POA: Insufficient documentation

## 2016-12-09 DIAGNOSIS — Y9389 Activity, other specified: Secondary | ICD-10-CM | POA: Diagnosis not present

## 2016-12-09 HISTORY — DX: Type 2 diabetes mellitus without complications: E11.9

## 2016-12-09 HISTORY — DX: Essential (primary) hypertension: I10

## 2016-12-09 MED ORDER — ACETAMINOPHEN 500 MG PO TABS: 500.0000 mg | ORAL_TABLET | Freq: Four times a day (QID) | ORAL | 0 refills | Status: AC | PRN

## 2016-12-09 MED ORDER — ACETAMINOPHEN 325 MG PO TABS
650.0000 mg | ORAL_TABLET | Freq: Once | ORAL | Status: AC
  Administered 2016-12-09: 650 mg via ORAL
  Filled 2016-12-09: qty 2

## 2016-12-09 NOTE — ED Notes (Signed)
Ortho tech called for knee sleeve and bariatric crutches

## 2016-12-09 NOTE — ED Provider Notes (Signed)
[facility]-EMERGENCY DEPT Provider Note   CSN: [id number] Arrival date & time: 12/09/16  1936   By signing my name below, I, Soijett Blue, attest that this documentation has been prepared under the direction and in the presence of Will Shanyla Marconi, PA-C Electronically Signed: Soijett Blue, ED Scribe. 12/09/16. 10:03 PM.  History   Chief Complaint Chief Complaint  Patient presents with  . Knee Pain    HPI Oscar Day is a 61 y.o. male with a PMHx of HTN, DM, who presents to the Emergency Department complaining of right knee pain onset PTA. Pt has not tried any medications for the relief of his symptoms. He states that he was attempting to aid his mother, who was trapped in her house when he slipped in mud, causing him to twist his right knee. He denies swelling, color change, wound, numbness, tingling, weakness, and any other symptoms.     The history is provided by the patient. No language interpreter was used.    Past Medical History:  Diagnosis Date  . Diabetes mellitus without complication (HCC)   . Hypertension     There are no active problems to display for this patient.   History reviewed. No pertinent surgical history.     Home Medications    Prior to Admission medications   Medication Sig Start Date End Date Taking? Authorizing Provider  acetaminophen (TYLENOL) 500 MG tablet Take 1 tablet (500 mg total) by mouth every 6 (six) hours as needed for mild pain or moderate pain. 12/09/16   Everlene Farrier, PA-C    Family History History reviewed. No pertinent family history.  Social History Social History  Substance Use Topics  . Smoking status: Current Every Day Smoker  . Smokeless tobacco: Never Used  . Alcohol use No     Allergies   Patient has no allergy information on record.   Review of Systems Review of Systems  Constitutional: Negative for fever.  Musculoskeletal: Positive for arthralgias (right knee and right wrist). Negative for back pain and joint  swelling.  Skin: Negative for color change, rash and wound.  Neurological: Negative for weakness and numbness.     Physical Exam Updated Vital Signs BP (!) 182/101 (BP Location: Right Arm)   Pulse 90   Temp 98.1 F (36.7 C) (Oral)   Resp (!) 22   Ht  (1.93 m)   Wt (!) 200.5 kg   SpO2 96%   BMI 53.80 kg/m   Physical Exam  Constitutional: He appears well-developed and well-nourished. No distress.  Nontoxic appearing.  HENT:  Head: Normocephalic and atraumatic.  Eyes: Right eye exhibits no discharge. Left eye exhibits no discharge.  Cardiovascular: Normal rate, regular rhythm and intact distal pulses.   Bilateral dp pulses intact.   Pulmonary/Chest: Effort normal. No respiratory distress.  Musculoskeletal: Normal range of motion. He exhibits edema and tenderness. He exhibits no deformity.       Right knee: He exhibits no ecchymosis. Tenderness found.  Tenderness to anterior and medial aspect of right knee without instability. No ecchymosis or warmth. No long bone tenderness. Good ROM of right knee.  Good cap refill to distal toes.   Neurological: He is alert. No sensory deficit. Coordination normal.  Skin: Skin is warm and dry. Capillary refill takes less than 2 seconds. No rash noted. He is not diaphoretic. No erythema. No pallor.  Psychiatric: He has a normal mood and affect. His behavior is normal.  Nursing note and vitals reviewed.    ED  Treatments / Results  DIAGNOSTIC STUDIES: Oxygen Saturation is 96% on RA, nl by my interpretation.    COORDINATION OF CARE: 10:06 PM Discussed treatment plan with pt at bedside which includes right knee xray and pt agreed to plan.   Radiology Dg Knee Complete 4 Views Right  Result Date: 12/09/2016 CLINICAL DATA:  62 year old male slipped and fell trying to get out of damage talus in severe whether. Acute right knee pain. Felt a pop. EXAM: RIGHT KNEE - COMPLETE 4+ VIEW COMPARISON:  None. FINDINGS: Trace if any right knee joint  effusion. Tricompartmental degenerative changes a especially patellofemoral and lateral compartment joint space loss and degenerative spurring. Better preserved medial compartment joint space. Patella intact. No acute osseous abnormality identified. Calcified peripheral vascular disease. IMPRESSION: 1. No acute fracture or dislocation identified about the right knee. 2. 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 Electronically Signed   By: Odessa Fleming M.D.   On: 12/09/2016 21:30    Procedures Procedures (including critical care time)  Medications Ordered in ED Medications  acetaminophen (TYLENOL) tablet 650 mg (not administered)     Initial Impression / Assessment and Plan / ED Course  I have reviewed the triage vital signs and the nursing notes.  Pertinent imaging results that were available during my care of the patient were reviewed by me and considered in my medical decision making (see chart for details).      Patient X-Ray negative for obvious fracture or dislocation. He is neurovascularly intact. No knee instability noted.  Pt advised to follow up with orthopedics if pain persists.  Patient given knee sleeve and crutches while in ED, conservative therapy recommended and discussed. Patient will be discharged home & is agreeable with above plan. Returns precautions discussed. Pt appears safe for discharge. Tylenol and ice for pain. Encouraged to elevate. I advised the patient to follow-up with their primary care provider this week. I advised the patient to return to the emergency department with new or worsening symptoms or new concerns. The patient verbalized understanding and agreement with plan.      Final Clinical Impressions(s) / ED Diagnoses   Final diagnoses:  Sprain of other ligament of right knee, initial encounter  Fall, initial encounter    New Prescriptions New Prescriptions   ACETAMINOPHEN (TYLENOL) 500 MG TABLET    Take 1 tablet (500 mg total) by  mouth every 6 (six) hours as needed for mild pain or moderate pain.   I personally performed the services described in this documentation, which was scribed in my presence. The recorded information has been reviewed and is accurate.       Everlene Farrier, PA-C 12/09/16 2212    Azalia Bilis, MD 12/11/16 [phone]

## 2016-12-09 NOTE — ED Triage Notes (Signed)
Pt states slipped in mud, twisted R knee. Pt complaining of R knee pain. Pt with full PMS. Pt with some decreased ROM of knee.

## 2016-12-09 NOTE — Progress Notes (Signed)
Orthopedic Tech Progress Note Patient Details:  Oscar Day Jan 02, 1956 [id number]  Ortho Devices Type of Ortho Device: Ace wrap, Crutches Ortho Device/Splint Location: RLE Ortho Device/Splint Interventions: Ordered, Application   Jennye Moccasin 12/09/2016, 10:23 PM

## 2016-12-12 ENCOUNTER — Other Ambulatory Visit: Payer: Self-pay | Admitting: Orthopedic Surgery

## 2016-12-12 DIAGNOSIS — S83241A Other tear of medial meniscus, current injury, right knee, initial encounter: Secondary | ICD-10-CM

## 2016-12-23 ENCOUNTER — Ambulatory Visit
Admission: RE | Admit: 2016-12-23 | Discharge: 2016-12-23 | Disposition: A | Discharge status: Home / Self Care | Payer: BLUE CROSS/BLUE SHIELD | Source: Ambulatory Visit | Attending: Orthopedic Surgery | Admitting: Orthopedic Surgery

## 2016-12-23 DIAGNOSIS — S83241A Other tear of medial meniscus, current injury, right knee, initial encounter: Secondary | ICD-10-CM

## 2017-02-06 ENCOUNTER — Telehealth: Payer: Self-pay | Admitting: Cardiovascular Disease

## 2017-02-06 NOTE — Telephone Encounter (Signed)
New message      Request for surgical clearance:  1. What type of surgery is being performed?  Rt knee arthroscopy 2. When is this surgery scheduled?  Pending clearance  Are there any medications that need to be held prior to surgery and how long? Need surgical clearance and not sure of meds to be held Name of physician performing surgery?  Dr Jodi GeraldsJohn Graves What is your office phone and fax number?  Fax [phone] Pt told this office that he saw Dr Allyson SabalBerry in the hospital

## 2017-02-06 NOTE — Telephone Encounter (Signed)
Will forward to dr berry for his review and advise

## 2017-02-10 NOTE — Telephone Encounter (Signed)
I don't see any notes the chart that I have taken care of this patient.

## 2017-02-11 NOTE — Telephone Encounter (Signed)
Looks like you saw the pt at the hospital back in 2010. Would you like pt to schedule appt with you for surgical clearance? And if so, request paper chart?

## 2017-02-17 NOTE — Telephone Encounter (Signed)
Yes please

## 2017-03-01 ENCOUNTER — Ambulatory Visit (INDEPENDENT_AMBULATORY_CARE_PROVIDER_SITE_OTHER): Payer: BLUE CROSS/BLUE SHIELD | Admitting: Cardiology

## 2017-03-01 ENCOUNTER — Encounter: Payer: Self-pay | Admitting: Cardiology

## 2017-03-01 VITALS — BP 150/90 | HR 61 | Ht 76.0 in | Wt 378.4 lb

## 2017-03-01 DIAGNOSIS — I10 Essential (primary) hypertension: Secondary | ICD-10-CM

## 2017-03-01 DIAGNOSIS — Z8669 Personal history of other diseases of the nervous system and sense organs: Secondary | ICD-10-CM

## 2017-03-01 DIAGNOSIS — Z01818 Encounter for other preprocedural examination: Secondary | ICD-10-CM | POA: Insufficient documentation

## 2017-03-01 DIAGNOSIS — E785 Hyperlipidemia, unspecified: Secondary | ICD-10-CM | POA: Insufficient documentation

## 2017-03-01 DIAGNOSIS — Z951 Presence of aortocoronary bypass graft: Secondary | ICD-10-CM | POA: Diagnosis not present

## 2017-03-01 NOTE — Assessment & Plan Note (Signed)
Fair control- followed by PCP

## 2017-03-01 NOTE — Patient Instructions (Signed)
Medication Instructions:  Your physician recommends that you continue on your current medications as directed. Please refer to the Current Medication list given to you today.  Labwork: NONE  Testing/Procedures: We will [facility], will be reviewed and clearance to be determined.    Follow-Up: Your physician wants you to follow-up in: 6 MONTHS with Dr. Allyson SabalBerry. You will receive a reminder letter in the mail two months in advance. If you don't receive a letter, please call our office to schedule the follow-up appointment.   Any Other Special Instructions Will Be Listed Below (If Applicable).     If you need a refill on your cardiac medications before your next appointment, please call your pharmacy.

## 2017-03-01 NOTE — Assessment & Plan Note (Signed)
He has lost weight, he was > 400 lbs, now dow to 378. He says he was turned down for lap banding in 2012-? reason

## 2017-03-01 NOTE — Assessment & Plan Note (Signed)
CABG x 30 Mar 2009- LIMA-LAD, SVG-OM, SVG-PDA, LRA-RI Pt has had functional study in the last [facility] and will try and get those results

## 2017-03-01 NOTE — Assessment & Plan Note (Signed)
Followed by PCP- on statin Rx 

## 2017-03-01 NOTE — Progress Notes (Signed)
03/01/2017 Oscar Day   1956-08-21  [id number]  Primary Physician Filomena JunglingEdwards, Jerry, NP Primary Cardiologist: Dr Allyson SabalBerry  HPI:  Pleasant 61 y/o obese AA male seen by Dr Allyson SabalBerry in Aug 2010. As a referral for chest pain. The pt had a cath and subsequent CABG x 4 with an LIMA-LAD, SVG-OM, SVG-PDA, and LRA to RI. Myoview in Nov 2010 was low risk. He was followed till 2012 and then was lost to follow up. The pt says he established himself with [facility] two years ago. He is on a good medical regimen and has not had angina. 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 He tells me he had an echo and a Myoview in the past year. He recently injured his Rt knee and needs arthroscopic surgery ( Dr Margo AyeJ. Graves). His wife's requested a visit here for cardiac clearance.    Current Outpatient Prescriptions  Medication Sig Dispense Refill  . acetaminophen (TYLENOL) 500 MG tablet Take 1 tablet (500 mg total) by mouth every 6 (six) hours as needed for mild pain or moderate pain. 30 tablet 0  . aspirin 325 MG tablet Take 325 mg by mouth daily.    Marland Kitchen. atorvastatin (LIPITOR) 40 MG tablet Take 40 mg by mouth.     . furosemide (LASIX) 40 MG tablet Take 40 mg by mouth daily.  3  . KLOR-CON M20 20 MEQ tablet Take 20 mEq by mouth daily.  3  . lisinopril (PRINIVIL,ZESTRIL) 10 MG tablet Take 10 mg by mouth daily.  1  . metoprolol tartrate (LOPRESSOR) 25 MG tablet Take 25 mg by mouth 2 (two) times daily.    . pantoprazole (PROTONIX) 40 MG tablet Take 40 mg by mouth daily.     Marland Kitchen. XIGDUO XR 12-998 MG TB24 Take 2 tablets by mouth daily.  2   No current facility-administered medications for this visit.     Not on File  Past Medical History:  Diagnosis Date  . CAD (coronary artery disease)   . Diabetes mellitus without complication (HCC)   . Hypertension   . Obesity   . Sleep apnea     Social History   Social History  . Marital status: Married    Spouse name: N/A  . Number of  children: N/A  . Years of education: N/A   Occupational History  . Not on file.   Social History Main Topics  . Smoking status: Current Every Day Smoker  . Smokeless tobacco: Never Used  . Alcohol use No  . Drug use: No  . Sexual activity: Not on file   Other Topics Concern  . Not on file   Social History Narrative  . No narrative on file     Family History  Problem Relation Age of Onset  . CAD Brother      Review of Systems: General: negative for chills, fever, night sweats or weight changes.  Cardiovascular: negative for chest pain, dyspnea on exertion, edema, orthopnea, palpitations, paroxysmal nocturnal dyspnea or shortness of breath Dermatological: negative for rash Respiratory: negative for cough or wheezing Urologic: negative for hematuria Abdominal: negative for nausea, vomiting, diarrhea, bright red blood per rectum, melena, or hematemesis Neurologic: negative for visual changes, syncope, or dizziness All other systems reviewed and are otherwise negative except as noted above.    Blood pressure (!) 150/90, pulse 61, height 6\' 4"  (1.93 m), weight (!) 378 lb 6.4 oz (171.6 kg).  General appearance: alert, cooperative, no  distress and morbidly obese Neck: no carotid bruit and no JVD Lungs: clear to auscultation bilaterally Heart: regular rate and rhythm Abdomen: obese, non tender Extremities: extremities normal, atraumatic, no cyanosis or edema and brace Rt knee Pulses: 2+ and symmetric Skin: Skin color, texture, turgor normal. 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 He says he was turned down for lap banding in 2012-? reason  History of sleep apnea He is intolerant to C-pap  Dyslipidemia Followed by PCP- on statin Rx   PLAN  I will see if I can get his most recent functional studies from [facility]. If these look OK he would be an acceptable risk from a cardiovascular standpoint for surgery. He will need to f/u with Dr Allyson Sabal in 6months. We will discuss possibly repeating his sleep study in the future.   Corine Shelter PA-C 03/01/2017 2:15 PM

## 2017-03-01 NOTE — Assessment & Plan Note (Signed)
He is intolerant to C-pap

## 2017-03-01 NOTE — Assessment & Plan Note (Signed)
Pt seen today for pre operative clearance prior to arthroscopic Rt knee surgery

## 2017-03-05 ENCOUNTER — Encounter: Payer: Self-pay | Admitting: *Deleted

## 2017-03-05 NOTE — Telephone Encounter (Signed)
Received records [facility] to review, pending surgical clearance.

## 2017-03-05 NOTE — Telephone Encounter (Signed)
This encounter was created in error - please disregard.

## 2017-03-21 ENCOUNTER — Telehealth: Payer: Self-pay | Admitting: Cardiology

## 2017-03-21 NOTE — Telephone Encounter (Signed)
We were waiting on [facility] which never came. In any event the pt has been active and has been chest pain free. He is a class 2 risk with 0.9% risk of an adverse cardiac outcome with proposed surgery and therefore is an acceptable risk from our standpoint.   Corine ShelterLUKE Ozias Dicenzo PA-C 03/21/2017 5:15 PM

## 2017-03-21 NOTE — Telephone Encounter (Signed)
New message     Is patient medically cleared for surgery ?  Pt has already seen [facility]

## 2017-03-21 NOTE — Telephone Encounter (Signed)
Request for surgical clearance:  1. What type of surgery is being performed?  Rt knee arthroscopy 2. When is this surgery scheduled?  Pending clearance  Are there any medications that need to be held prior to surgery and how long? Need surgical clearance and not sure of meds to be held  Name of physician performing surgery?  Dr Jodi GeraldsJohn Graves  What is your office phone and fax number?  Fax [phone] Pt told this office that he saw Dr Allyson SabalBerry in the hospital   [facility] for [place] recommendation. He saw him on 7/6 for surgical clearance.

## 2018-03-26 ENCOUNTER — Emergency Department (HOSPITAL_COMMUNITY)
Admission: EM | Admit: 2018-03-26 | Discharge: 2018-03-26 | Disposition: A | Discharge status: Home / Self Care | Payer: No Typology Code available for payment source | Attending: Emergency Medicine | Admitting: Emergency Medicine

## 2018-03-26 ENCOUNTER — Encounter (HOSPITAL_COMMUNITY): Payer: Self-pay | Admitting: Emergency Medicine

## 2018-03-26 ENCOUNTER — Other Ambulatory Visit: Payer: Self-pay

## 2018-03-26 DIAGNOSIS — N3091 Cystitis, unspecified with hematuria: Secondary | ICD-10-CM | POA: Diagnosis not present

## 2018-03-26 DIAGNOSIS — I10 Essential (primary) hypertension: Secondary | ICD-10-CM | POA: Diagnosis not present

## 2018-03-26 DIAGNOSIS — R319 Hematuria, unspecified: Secondary | ICD-10-CM | POA: Diagnosis present

## 2018-03-26 DIAGNOSIS — Z7982 Long term (current) use of aspirin: Secondary | ICD-10-CM | POA: Insufficient documentation

## 2018-03-26 DIAGNOSIS — Z79899 Other long term (current) drug therapy: Secondary | ICD-10-CM | POA: Diagnosis not present

## 2018-03-26 DIAGNOSIS — E119 Type 2 diabetes mellitus without complications: Secondary | ICD-10-CM | POA: Insufficient documentation

## 2018-03-26 DIAGNOSIS — I2510 Atherosclerotic heart disease of native coronary artery without angina pectoris: Secondary | ICD-10-CM | POA: Diagnosis not present

## 2018-03-26 DIAGNOSIS — F17200 Nicotine dependence, unspecified, uncomplicated: Secondary | ICD-10-CM | POA: Insufficient documentation

## 2018-03-26 DIAGNOSIS — N3001 Acute cystitis with hematuria: Secondary | ICD-10-CM

## 2018-03-26 LAB — URINALYSIS, ROUTINE W REFLEX MICROSCOPIC

## 2018-03-26 LAB — URINALYSIS, MICROSCOPIC (REFLEX): RBC / HPF: >50 RBC/hpf (ref 0–5)

## 2018-03-26 MED ORDER — CIPROFLOXACIN HCL 500 MG PO TABS: 500.0000 mg | ORAL_TABLET | Freq: Two times a day (BID) | ORAL | 0 refills | Status: DC

## 2018-03-26 MED ORDER — ONDANSETRON 4 MG PO TBDP
4.0000 mg | ORAL_TABLET | Freq: Once | ORAL | Status: AC
  Administered 2018-03-26: 4 mg via ORAL
  Filled 2018-03-26: qty 1

## 2018-03-26 NOTE — ED Notes (Signed)
Bladder scan completed, 112 ml resulted.

## 2018-03-26 NOTE — Discharge Instructions (Addendum)
Please read attached information. If you experience any new or worsening signs or symptoms please return to the emergency room for evaluation. Please follow-up with your primary care provider or specialist as discussed. Please use medication prescribed only as directed and discontinue taking if you have any concerning signs or symptoms.   °

## 2018-03-26 NOTE — ED Provider Notes (Signed)
[facility] EMERGENCY DEPARTMENT Provider Note   CSN: [id number] Arrival date & time: 03/26/18  1205     History   Chief Complaint Chief Complaint  Patient presents with  . Hematuria    HPI Oscar Day is a 62 y.o. male.  HPI   75 YOF male presents today with complaints of dysuria and hematuria. Patient notes he was in his usual state of health this morning. He notes the sensation to void with burning with urination and red colored urine. Patient notes that he feels as if he cannot empty his bladder but does not have a noticeable with the bladder fullness. Patient denies any flank pain, abdominal pain, fever chills nausea or vomiting. He denies any abnormal bruising or bleeding, notes that he does not take blood thinners but does take daily aspirin. History of urinary tract infection. Patient is currently on Augmentin as he recently had a tooth extracted.    Past Medical History:  Diagnosis Date  . CAD (coronary artery disease)   . Diabetes mellitus without complication (HCC)   . Hypertension   . Obesity   . Sleep apnea     Patient Active Problem List   Diagnosis Date Noted  . Hx of CABG 03/01/2017  . Morbid obesity (HCC) 03/01/2017  . History of sleep apnea 03/01/2017  . Essential hypertension 03/01/2017  . Dyslipidemia 03/01/2017  . Pre-operative clearance 03/01/2017    Past Surgical History:  Procedure Laterality Date  . CORONARY ARTERY BYPASS GRAFT  2010        Home Medications    Prior to Admission medications   Medication Sig Start Date End Date Taking? Authorizing Provider  acetaminophen (TYLENOL) 500 MG tablet Take 1 tablet (500 mg total) by mouth every 6 (six) hours as needed for mild pain or moderate pain. 12/09/16   Everlene Farrier, PA-C  aspirin 325 MG tablet Take 325 mg by mouth daily.    [provider]  atorvastatin (LIPITOR) 40 MG tablet Take 40 mg by mouth.  11/30/16   [provider]  ciprofloxacin  (CIPRO) 500 MG tablet Take 1 tablet (500 mg total) by mouth every 12 (twelve) hours. 03/26/18   Cardarius Senat, Tinnie Gens, PA-C  furosemide (LASIX) 40 MG tablet Take 40 mg by mouth daily. 02/17/17   [provider]  KLOR-CON M20 20 MEQ tablet Take 20 mEq by mouth daily. 02/09/17   [provider]  lisinopril (PRINIVIL,ZESTRIL) 10 MG tablet Take 10 mg by mouth daily. 02/07/17   [provider]  metoprolol tartrate (LOPRESSOR) 25 MG tablet Take 25 mg by mouth 2 (two) times daily.    [provider]  pantoprazole (PROTONIX) 40 MG tablet Take 40 mg by mouth daily.  12/07/16   [provider]  XIGDUO XR 12-998 MG TB24 Take 2 tablets by mouth daily. 02/10/17   [provider]    Family History Family History  Problem Relation Age of Onset  . CAD Brother     Social History Social History   Tobacco Use  . Smoking status: Current Every Day Smoker  . Smokeless tobacco: Never Used  Substance Use Topics  . Alcohol use: No  . Drug use: No     Allergies   Patient has no known allergies.   Review of Systems Review of Systems  All other systems reviewed and are negative.    Physical Exam Updated Vital Signs BP (!) 189/83 (BP Location: Right Wrist)   Pulse (!) 58  Temp 98.6 F (37 C) (Oral)   Resp 16   SpO2 98%   Physical Exam  Constitutional: He is oriented to person, place, and time. He appears well-developed and well-nourished.  HENT:  Head: Normocephalic and atraumatic.  Eyes: Pupils are equal, round, and reactive to light. Conjunctivae are normal. Right eye exhibits no discharge. Left eye exhibits no discharge. No scleral icterus.  Neck: Normal range of motion. No JVD present. No tracheal deviation present.  Pulmonary/Chest: Effort normal. No stridor.  Abdominal: Soft. He exhibits no distension and no mass. There is no tenderness. There is no rebound and no guarding. No hernia.  Genitourinary:  Genitourinary Comments: uncircumcised  penis with no discharge or bleeding  Neurological: He is alert and oriented to person, place, and time. Coordination normal.  Psychiatric: He has a normal mood and affect. His behavior is normal. Judgment and thought content normal.  Nursing note and vitals reviewed.   ED Treatments / Results  Labs (all labs ordered are listed, but only abnormal results are displayed) Labs Reviewed  URINALYSIS, ROUTINE W REFLEX MICROSCOPIC - Abnormal; Notable for the following components:      Result Value   Color, Urine RED (*)    APPearance CLOUDY (*)    Glucose, UA   (*)    Value: TEST NOT REPORTED DUE TO COLOR INTERFERENCE OF URINE PIGMENT   Hgb urine dipstick   (*)    Value: TEST NOT REPORTED DUE TO COLOR INTERFERENCE OF URINE PIGMENT   Bilirubin Urine   (*)    Value: TEST NOT REPORTED DUE TO COLOR INTERFERENCE OF URINE PIGMENT   Ketones, ur   (*)    Value: TEST NOT REPORTED DUE TO COLOR INTERFERENCE OF URINE PIGMENT   Protein, ur   (*)    Value: TEST NOT REPORTED DUE TO COLOR INTERFERENCE OF URINE PIGMENT   Nitrite   (*)    Value: TEST NOT REPORTED DUE TO COLOR INTERFERENCE OF URINE PIGMENT   Leukocytes, UA   (*)    Value: TEST NOT REPORTED DUE TO COLOR INTERFERENCE OF URINE PIGMENT   All other components within normal limits  URINALYSIS, MICROSCOPIC (REFLEX) - Abnormal; Notable for the following components:   Bacteria, UA FEW (*)    All other components within normal limits  URINE CULTURE    EKG None  Radiology No results found.  Procedures Procedures (including critical care time)  Medications Ordered in ED Medications  ondansetron (ZOFRAN-ODT) disintegrating tablet 4 mg (4 mg Oral Given 03/26/18 1414)     Initial Impression / Assessment and Plan / ED Course  I have reviewed the triage vital signs and the nursing notes.  Pertinent labs & imaging results that were available during my care of the patient were reviewed by me and considered in my medical decision making (see  chart for details).     Labs: UA, Urine culture,   Imaging:  Consults:  Therapeutics:  Discharge Meds: Cipro  Assessment/Plan: 62-year-old male presents today with dysuria and hematuria most consistent with urinary tract infection.  Patient does have 21-50 white blood cell count, few bacteria.  Patient's ultrasound shows 112 mL, he is able to urinate without difficulty.  No signs of obstruction or stricture.  Patient candidate for urine culture outpatient antibiotic therapy with close follow-up with urology if symptoms persist immediately return to the emergency room if they worsen.  Patient has no signs of systemic illness.  Discharged with Cipro, given strict return precautions, he verbalized understanding  and agreement to today's plan had no further questions or concerns.   Final Clinical Impressions(s) / ED Diagnoses   Final diagnoses:  Acute cystitis with hematuria    ED Discharge Orders        Ordered    ciprofloxacin (CIPRO) 500 MG tablet  Every 12 hours     03/26/18 1418       Eyvonne Mechanic, PA-C 03/26/18 2128    Bethann Berkshire, MD 03/29/18 [phone]

## 2018-03-26 NOTE — ED Triage Notes (Signed)
Patient complains of hematuria with painful urination that started at 1100 this today. States "it feels like something is in my ureter that is stopping urine from passing through". Patient alert, oriented, and ambulating with steady gait.

## 2018-03-27 LAB — URINE CULTURE

## 2019-10-10 ENCOUNTER — Ambulatory Visit: Payer: PRIVATE HEALTH INSURANCE

## 2021-02-13 ENCOUNTER — Other Ambulatory Visit: Payer: Self-pay | Admitting: Internal Medicine

## 2021-02-14 LAB — BASIC METABOLIC PANEL WITH GFR
BUN: 24 mg/dL (ref 7–25)
CO2: 29 mmol/L (ref 20–32)
Calcium: 9.1 mg/dL (ref 8.6–10.3)
Chloride: 99 mmol/L (ref 98–110)
Creat: 0.98 mg/dL (ref 0.70–1.25)
GFR, Est African American: 95 mL/min/{1.73_m2} (ref >=60)
GFR, Est Non African American: 82 mL/min/{1.73_m2} (ref >=60)
Glucose, Bld: 214 mg/dL — ABNORMAL HIGH (ref 65–99)
Potassium: 4.1 mmol/L (ref 3.5–5.3)
Sodium: 140 mmol/L (ref 135–146)

## 2021-02-14 LAB — CBC
HCT: 38.9 % (ref 38.5–50.0)
Hemoglobin: 12.6 g/dL — ABNORMAL LOW (ref 13.2–17.1)
MCH: 26.1 pg — ABNORMAL LOW (ref 27.0–33.0)
MCHC: 32.4 g/dL (ref 32.0–36.0)
MCV: 80.5 fL (ref 80.0–100.0)
MPV: 8.9 fL (ref 7.5–12.5)
Platelets: 463 10*3/uL — ABNORMAL HIGH (ref 140–400)
RBC: 4.83 10*6/uL (ref 4.20–5.80)
RDW: 15.3 % — ABNORMAL HIGH (ref 11.0–15.0)
WBC: 14.2 10*3/uL — ABNORMAL HIGH (ref 3.8–10.8)

## 2021-02-14 LAB — VITAMIN B12: Vitamin B-12: 445 pg/mL (ref 200–1100)

## 2021-02-14 LAB — FOLATE: Folate: 7.9 ng/mL

## 2021-02-14 LAB — VITAMIN D 25 HYDROXY (VIT D DEFICIENCY, FRACTURES): Vit D, 25-Hydroxy: 99 ng/mL (ref 30–100)

## 2021-05-29 ENCOUNTER — Other Ambulatory Visit: Payer: Self-pay | Admitting: Internal Medicine

## 2021-05-30 LAB — COMPLETE METABOLIC PANEL WITH GFR
AG Ratio: 1.2 (calc) (ref 1.0–2.5)
ALT: 9 U/L (ref 9–46)
AST: 11 U/L (ref 10–35)
Albumin: 3.9 g/dL (ref 3.6–5.1)
Alkaline phosphatase (APISO): 78 U/L (ref 35–144)
BUN: 23 mg/dL (ref 7–25)
CO2: 29 mmol/L (ref 20–32)
Calcium: 9.4 mg/dL (ref 8.6–10.3)
Chloride: 99 mmol/L (ref 98–110)
Creat: 1.2 mg/dL (ref 0.70–1.35)
Globulin: 3.2 g/dL (calc) (ref 1.9–3.7)
Glucose, Bld: 129 mg/dL — ABNORMAL HIGH (ref 65–99)
Potassium: 4.4 mmol/L (ref 3.5–5.3)
Sodium: 141 mmol/L (ref 135–146)
Total Bilirubin: 0.6 mg/dL (ref 0.2–1.2)
Total Protein: 7.1 g/dL (ref 6.1–8.1)
eGFR: 68 mL/min/{1.73_m2} (ref >=60)

## 2021-05-30 LAB — EXTRA LAV TOP TUBE

## 2021-05-30 LAB — LIPID PANEL
Cholesterol: 210 mg/dL — ABNORMAL HIGH (ref <200)
HDL: 44 mg/dL (ref >=40)
LDL Cholesterol (Calc): 147 mg/dL (calc) — ABNORMAL HIGH
Non-HDL Cholesterol (Calc): 166 mg/dL (calc) — ABNORMAL HIGH (ref <130)
Total CHOL/HDL Ratio: 4.8 (calc) (ref <5.0)
Triglycerides: 82 mg/dL (ref <150)

## 2021-05-30 LAB — TSH: TSH: 0.57 mIU/L (ref 0.40–4.50)

## 2021-05-30 LAB — PSA: PSA: 0.13 ng/mL (ref <=4.00)

## 2021-10-23 ENCOUNTER — Other Ambulatory Visit: Payer: Self-pay | Admitting: Internal Medicine

## 2021-10-27 LAB — CBC
HCT: 37 % — ABNORMAL LOW (ref 38.5–50.0)
Hemoglobin: 11.8 g/dL — ABNORMAL LOW (ref 13.2–17.1)
MCH: 26.5 pg — ABNORMAL LOW (ref 27.0–33.0)
MCHC: 31.9 g/dL — ABNORMAL LOW (ref 32.0–36.0)
MCV: 83.1 fL (ref 80.0–100.0)
MPV: 8.7 fL (ref 7.5–12.5)
Platelets: 413 10*3/uL — ABNORMAL HIGH (ref 140–400)
RBC: 4.45 10*6/uL (ref 4.20–5.80)
RDW: 16.2 % — ABNORMAL HIGH (ref 11.0–15.0)
WBC: 11.4 10*3/uL — ABNORMAL HIGH (ref 3.8–10.8)

## 2021-10-27 LAB — TSH: TSH: 1.03 mIU/L (ref 0.40–4.50)

## 2021-10-27 LAB — BASIC METABOLIC PANEL WITH GFR
BUN: 24 mg/dL (ref 7–25)
CO2: 31 mmol/L (ref 20–32)
Calcium: 9.4 mg/dL (ref 8.6–10.3)
Chloride: 102 mmol/L (ref 98–110)
Creat: 1.19 mg/dL (ref 0.70–1.35)
Glucose, Bld: 122 mg/dL — ABNORMAL HIGH (ref 65–99)
Potassium: 4.7 mmol/L (ref 3.5–5.3)
Sodium: 143 mmol/L (ref 135–146)
eGFR: 69 mL/min/{1.73_m2} (ref >=60)

## 2021-10-27 LAB — CLIENT EDUCATION TRACKING

## 2021-10-27 LAB — VITAMIN B12: Vitamin B-12: 440 pg/mL (ref 200–1100)

## 2021-10-27 LAB — IRON,?TOTAL/TOTAL IRON BINDING CAP
%SAT: 23 % (calc) (ref 20–48)
Iron: 65 ug/dL (ref 50–180)

## 2021-10-27 LAB — RETICULOCYTES

## 2021-10-27 LAB — FERRITIN: Ferritin: 72 ng/mL (ref 24–380)

## 2021-10-27 LAB — MAGNESIUM: Magnesium: 2.2 mg/dL (ref 1.5–2.5)

## 2021-10-27 LAB — VITAMIN D 25 HYDROXY (VIT D DEFICIENCY, FRACTURES): Vit D, 25-Hydroxy: 97 ng/mL (ref 30–100)

## 2021-10-27 LAB — FOLATE: Folate: 6.1 ng/mL

## 2021-10-27 LAB — IRON, TOTAL/TOTAL IRON BINDING CAP: TIBC: 278 mcg/dL (calc) (ref 250–425)

## 2021-12-20 ENCOUNTER — Observation Stay (HOSPITAL_COMMUNITY)
Admission: EM | Admit: 2021-12-20 | Discharge: 2021-12-20 | Disposition: A | Discharge status: Home / Self Care | Payer: BLUE CROSS/BLUE SHIELD | Attending: Internal Medicine | Admitting: Internal Medicine

## 2021-12-20 ENCOUNTER — Other Ambulatory Visit: Payer: Self-pay

## 2021-12-20 DIAGNOSIS — D72829 Elevated white blood cell count, unspecified: Secondary | ICD-10-CM | POA: Insufficient documentation

## 2021-12-20 DIAGNOSIS — D75839 Thrombocytosis, unspecified: Secondary | ICD-10-CM | POA: Insufficient documentation

## 2021-12-20 DIAGNOSIS — Z7982 Long term (current) use of aspirin: Secondary | ICD-10-CM | POA: Insufficient documentation

## 2021-12-20 DIAGNOSIS — I2510 Atherosclerotic heart disease of native coronary artery without angina pectoris: Secondary | ICD-10-CM | POA: Diagnosis not present

## 2021-12-20 DIAGNOSIS — F17200 Nicotine dependence, unspecified, uncomplicated: Secondary | ICD-10-CM | POA: Insufficient documentation

## 2021-12-20 DIAGNOSIS — E1165 Type 2 diabetes mellitus with hyperglycemia: Secondary | ICD-10-CM | POA: Insufficient documentation

## 2021-12-20 DIAGNOSIS — I10 Essential (primary) hypertension: Secondary | ICD-10-CM | POA: Insufficient documentation

## 2021-12-20 DIAGNOSIS — T783XXA Angioneurotic edema, initial encounter: Secondary | ICD-10-CM | POA: Diagnosis not present

## 2021-12-20 DIAGNOSIS — T7840XA Allergy, unspecified, initial encounter: Secondary | ICD-10-CM | POA: Diagnosis present

## 2021-12-20 DIAGNOSIS — Z951 Presence of aortocoronary bypass graft: Secondary | ICD-10-CM | POA: Insufficient documentation

## 2021-12-20 DIAGNOSIS — K219 Gastro-esophageal reflux disease without esophagitis: Secondary | ICD-10-CM | POA: Insufficient documentation

## 2021-12-20 DIAGNOSIS — Z79899 Other long term (current) drug therapy: Secondary | ICD-10-CM | POA: Insufficient documentation

## 2021-12-20 DIAGNOSIS — Z7984 Long term (current) use of oral hypoglycemic drugs: Secondary | ICD-10-CM | POA: Insufficient documentation

## 2021-12-20 LAB — BASIC METABOLIC PANEL
Anion gap: 9 (ref 5–15)
BUN: 26 mg/dL — ABNORMAL HIGH (ref 8–23)
CO2: 26 mmol/L (ref 22–32)
Calcium: 9.1 mg/dL (ref 8.9–10.3)
Chloride: 103 mmol/L (ref 98–111)
Creatinine, Ser: 1.29 mg/dL — ABNORMAL HIGH (ref 0.61–1.24)
GFR, Estimated: >60 mL/min (ref >60)
Glucose, Bld: 128 mg/dL — ABNORMAL HIGH (ref 70–99)
Potassium: 4.4 mmol/L (ref 3.5–5.1)
Sodium: 138 mmol/L (ref 135–145)

## 2021-12-20 LAB — CBC
HCT: 39.5 % (ref 39.0–52.0)
Hemoglobin: 12.9 g/dL — ABNORMAL LOW (ref 13.0–17.0)
MCH: 27 pg (ref 26.0–34.0)
MCHC: 32.7 g/dL (ref 30.0–36.0)
MCV: 82.6 fL (ref 80.0–100.0)
Platelets: 453 10*3/uL — ABNORMAL HIGH (ref 150–400)
RBC: 4.78 MIL/uL (ref 4.22–5.81)
RDW: 17.5 % — ABNORMAL HIGH (ref 11.5–15.5)
WBC: 15.2 10*3/uL — ABNORMAL HIGH (ref 4.0–10.5)
nRBC: 0 % (ref 0.0–0.2)

## 2021-12-20 LAB — CBG MONITORING, ED: Glucose-Capillary: 216 mg/dL — ABNORMAL HIGH (ref 70–99)

## 2021-12-20 LAB — HEMOGLOBIN A1C
Hgb A1c MFr Bld: 7.7 % — ABNORMAL HIGH (ref 4.8–5.6)
Mean Plasma Glucose: 174.29 mg/dL

## 2021-12-20 LAB — HIV ANTIBODY (ROUTINE TESTING W REFLEX): HIV Screen 4th Generation wRfx: NONREACTIVE

## 2021-12-20 MED ORDER — EPINEPHRINE 0.3 MG/0.3ML IJ SOAJ
0.3000 mg | INTRAMUSCULAR | 2 refills | Status: AC | PRN
Start: 2021-12-20 — End: ?

## 2021-12-20 MED ORDER — POLYETHYLENE GLYCOL 3350 17 G PO PACK
17.0000 g | PACK | Freq: Every day | ORAL | Status: DC | PRN
Start: 2021-12-20 — End: 2021-12-20

## 2021-12-20 MED ORDER — PROCHLORPERAZINE EDISYLATE 10 MG/2ML IJ SOLN: 10.0000 mg | Freq: Four times a day (QID) | INTRAMUSCULAR | Status: DC | PRN

## 2021-12-20 MED ORDER — EPINEPHRINE PF 1 MG/ML IJ SOLN: 0.3000 mg | Freq: Once | INTRAMUSCULAR | Status: DC | PRN

## 2021-12-20 MED ORDER — FAMOTIDINE IN NACL 20-0.9 MG/50ML-% IV SOLN
20.0000 mg | INTRAVENOUS | Status: AC
  Administered 2021-12-20: 20 mg via INTRAVENOUS
  Filled 2021-12-20: qty 50

## 2021-12-20 MED ORDER — DIPHENHYDRAMINE HCL 50 MG/ML IJ SOLN
INTRAMUSCULAR | Status: AC
  Filled 2021-12-20: qty 1

## 2021-12-20 MED ORDER — INSULIN ASPART 100 UNIT/ML IJ SOLN
0.0000 [IU] | Freq: Three times a day (TID) | INTRAMUSCULAR | Status: DC
  Administered 2021-12-20: 7 [IU] via SUBCUTANEOUS

## 2021-12-20 MED ORDER — INSULIN ASPART 100 UNIT/ML IJ SOLN: 0.0000 [IU] | Freq: Every day | INTRAMUSCULAR | Status: DC

## 2021-12-20 MED ORDER — PREDNISONE 10 MG PO TABS: ORAL_TABLET | ORAL | 0 refills | Status: AC

## 2021-12-20 MED ORDER — FAMOTIDINE 20 MG PO TABS: 20.0000 mg | ORAL_TABLET | Freq: Two times a day (BID) | ORAL | 0 refills | Status: DC

## 2021-12-20 MED ORDER — METHYLPREDNISOLONE SODIUM SUCC 125 MG IJ SOLR
80.0000 mg | INTRAMUSCULAR | Status: AC
  Administered 2021-12-20: 80 mg via INTRAVENOUS
  Filled 2021-12-20: qty 2

## 2021-12-20 MED ORDER — HYDROCHLOROTHIAZIDE 12.5 MG PO CAPS: 12.5000 mg | ORAL_CAPSULE | Freq: Every day | ORAL | 2 refills | Status: AC

## 2021-12-20 MED ORDER — EPINEPHRINE 0.3 MG/0.3ML IJ SOAJ
INTRAMUSCULAR | Status: AC
  Filled 2021-12-20: qty 0.3

## 2021-12-20 MED ORDER — MELATONIN 3 MG PO TABS: 3.0000 mg | ORAL_TABLET | Freq: Every evening | ORAL | Status: DC | PRN

## 2021-12-20 MED ORDER — DIPHENHYDRAMINE HCL 50 MG/ML IJ SOLN
25.0000 mg | INTRAMUSCULAR | Status: AC
  Administered 2021-12-20: 25 mg via INTRAVENOUS
  Filled 2021-12-20: qty 1

## 2021-12-20 MED ORDER — DIPHENHYDRAMINE HCL 25 MG PO TABS
25.0000 mg | ORAL_TABLET | Freq: Four times a day (QID) | ORAL | 0 refills | Status: AC | PRN
Start: 2021-12-20 — End: ?

## 2021-12-20 MED ORDER — ACETAMINOPHEN 325 MG PO TABS: 650.0000 mg | ORAL_TABLET | Freq: Four times a day (QID) | ORAL | Status: DC | PRN

## 2021-12-20 MED ORDER — ATORVASTATIN CALCIUM 40 MG PO TABS: 40.0000 mg | ORAL_TABLET | Freq: Every day | ORAL | Status: DC

## 2021-12-20 MED ORDER — ASPIRIN EC 325 MG PO TBEC: 325.0000 mg | DELAYED_RELEASE_TABLET | Freq: Every day | ORAL | Status: DC

## 2021-12-20 MED ORDER — LACTATED RINGERS IV SOLN: INTRAVENOUS | Status: DC

## 2021-12-20 MED ORDER — FAMOTIDINE IN NACL 20-0.9 MG/50ML-% IV SOLN
INTRAVENOUS | Status: AC
  Filled 2021-12-20: qty 50

## 2021-12-20 MED ORDER — PANTOPRAZOLE SODIUM 40 MG PO TBEC: 40.0000 mg | DELAYED_RELEASE_TABLET | Freq: Every day | ORAL | Status: DC

## 2021-12-20 MED ORDER — METHYLPREDNISOLONE SODIUM SUCC 125 MG IJ SOLR
INTRAMUSCULAR | Status: AC
  Administered 2021-12-20: 125 mg
  Filled 2021-12-20: qty 2

## 2021-12-20 NOTE — H&P (Addendum)
?History and Physical ? ?Oscar Lookdward T Kersten ONG:[id number] DOB: [birth year] DOA: 12/20/2021 ? ?Referring physician: Dr. Bebe ShaggyWickline, EDP  ?PCP: Fleet ContrasAvbuere, Edwin, MD  ?Outpatient Specialists: None ?Patient coming from: Home ? ?Chief Complaint: Tongue swelling.  ? ?HPI: Oscar Day is a 66 y.o. male with medical history significant for obesity, coronary artery disease status post CABG, hyperlipidemia, hypertension on lisinopril twice daily who presented to [facility] ED with complaints of tongue swelling and drooling, 6 hours after eating shrimp fried rice for dinner.  He has eaten shrimps in the past and has never had this reaction.  Patient has no known shellfish allergy.  He denies a rash, itching or difficulty breathing.  He was given 2 tablets of benadryl by his wife at home but the swelling worsened.  No prior similar allergic reaction.  Brought in to the ED via private vehicle by his wife.  In the ED, due to concern for ACE inhibitor induced angioedema, he was treated with a dose of EpiPen injection, 1 dose of IV Benadryl, and 1 dose of IV Pepcid.  No stridors or wheezing on exam.  Patient is on lisinopril twice a day per his wife at bedside.  TRH, hospitalist service, was asked to admit for close observation to Progressive/Stepdown unit. ? ?ED Course:  Tmax 98.4.  BP 134/71, pulse 66, respiratory 10, saturation 99% on room air.  Lab studies remarkable for serum glucose 128, BUN 26, creatinine 1.29, at baseline.  WBC 15.2, hemoglobin 12.9, MCV 82, platelet count 453. ? ?Review of Systems: ?Review of systems as noted in the HPI. All other systems reviewed and are negative. ? ? ?Past Medical History:  ?Diagnosis Date  ? CAD (coronary artery disease)   ? Diabetes mellitus without complication (HCC)   ? Hypertension   ? Obesity   ? Sleep apnea   ? ?Past Surgical History:  ?Procedure Laterality Date  ? CORONARY ARTERY BYPASS GRAFT  2010  ? ? ?Social History:  reports that he has been smoking. He has never used smokeless tobacco. He  reports that he does not drink alcohol and does not use drugs. ? ? ?No Known Allergies ? ?Family History  ?Problem Relation Age of Onset  ? CAD Brother   ?  ? ? ?Prior to Admission medications   ?Medication Sig Start Date End Date Taking? Authorizing Provider  ?acetaminophen (TYLENOL) 500 MG tablet Take 1 tablet (500 mg total) by mouth every 6 (six) hours as needed for mild pain or moderate pain. 12/09/16   Everlene Farrieransie, William, PA-C  ?aspirin 325 MG tablet Take 325 mg by mouth daily.    [provider]  ?atorvastatin (LIPITOR) 40 MG tablet Take 40 mg by mouth.  11/30/16   [provider]  ?ciprofloxacin (CIPRO) 500 MG tablet Take 1 tablet (500 mg total) by mouth every 12 (twelve) hours. 03/26/18   Hedges, Tinnie GensJeffrey, PA-C  ?furosemide (LASIX) 40 MG tablet Take 40 mg by mouth daily. 02/17/17   [provider]  ?KLOR-CON M20 20 MEQ tablet Take 20 mEq by mouth daily. 02/09/17   [provider]  ?lisinopril (PRINIVIL,ZESTRIL) 10 MG tablet Take 10 mg by mouth daily. 02/07/17   [provider]  ?metoprolol tartrate (LOPRESSOR) 25 MG tablet Take 25 mg by mouth 2 (two) times daily.    [provider]  ?pantoprazole (PROTONIX) 40 MG tablet Take 40 mg by mouth daily.  12/07/16   [provider]  ?XIGDUO XR 12-998 MG TB24 Take 2 tablets by mouth daily.  02/10/17   [provider]  ? ? ?Physical Exam: ?BP 138/83   Pulse (!) 55   Temp 98.4 ?F (36.9 ?C) (Oral)   Resp 17   SpO2 99%  ? ?General: 66 y.o. year-old male well developed well nourished in no acute distress.  Alert and oriented x3. ?Cardiovascular: Regular rate and rhythm with no rubs or gallops.  No thyromegaly or JVD noted.  No lower extremity edema. 2/4 pulses in all 4 extremities. ?Respiratory: Clear to auscultation with no wheezes or rales.  Tongue swelling. ?Abdomen: Soft nontender nondistended with normal bowel sounds x4 quadrants. ?Muskuloskeletal: No cyanosis, clubbing or edema noted bilaterally ?Neuro:  CN II-XII intact, strength, sensation, reflexes ?Skin: No ulcerative lesions noted or rashes ?Psychiatry: Judgement and insight appear normal. Mood is appropriate for condition and setting ?   ?   ?   ?Labs on Admission:  ?Basic Metabolic Panel: ?Recent Labs  ?Lab 12/20/21 ?[id number]  ?NA 138  ?K 4.4  ?CL 103  ?CO2 26  ?GLUCOSE 128*  ?BUN 26*  ?CREATININE 1.29*  ?CALCIUM 9.1  ? ?Liver Function Tests: ?No results for input(s): AST, ALT, ALKPHOS, BILITOT, PROT, ALBUMIN in the last 168 hours. ?No results for input(s): LIPASE, AMYLASE in the last 168 hours. ?No results for input(s): AMMONIA in the last 168 hours. ?CBC: ?Recent Labs  ?Lab 12/20/21 ?[id number]  ?WBC 15.2*  ?HGB 12.9*  ?HCT 39.5  ?MCV 82.6  ?PLT 453*  ? ?Cardiac Enzymes: ?No results for input(s): CKTOTAL, CKMB, CKMBINDEX, TROPONINI in the last 168 hours. ? ?BNP (last 3 results) ?No results for input(s): BNP in the last 8760 hours. ? ?ProBNP (last 3 results) ?No results for input(s): PROBNP in the last 8760 hours. ? ?CBG: ?No results for input(s): GLUCAP in the last 168 hours. ? ?Radiological Exams on Admission: ?No results found. ? ?EKG: I independently viewed the EKG done and my findings are as followed: None available at the time of the dictation. ? ?Assessment/Plan ?Present on Admission: ? Angioedema ? ?Principal Problem: ?  Angioedema ? 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  No rash or itching. ?Patient is on lisinopril twice daily as reported by family. ?Lisinopril added to list of allergies. ?Patient will need to be discharged with a prescription for EpiPen ?Received a dose of EpiPen in the ED. ?Stat IV Benadryl 25 mg x 1, IV Solu-Medrol 80 mg x 1, IV Pepcid 20 mg x 1 ordered. ?Avoid hypotension, maintain MAP greater than 65 ?Added LR 50 cc/h x 1 day. ?Closely monitor overnight. ? ?Leukocytosis and thrombocytosis, suspect reactive in  the setting of angioedema ?Nonseptic appearing ?Afebrile ?Antibiotics are not indicated ? ?Essential hypertension ?BP stable, hold off home oral antihypertensives to avoid hypotension. ?Maintain MAP greater than 65 in the setting of allergic reaction. ?Close monitoring of vital signs. ? ?Type 2 diabetes with hyperglycemia ?Last hemoglobin A1c 7.8 on 04/01/2009 ?Repeat hemoglobin A1c ?Hold off home oral hypoglycemics ?Start insulin sliding scale. ? ?Coronary artery disease status post CABG ?No anginal symptoms reported. ?Resume home aspirin and Lipitor. ?Closely monitor on telemetry ? ?Hyperlipidemia ?Resume home Lipitor. ? ?GERD ?Resume home PPI ? ?Morbid obesity ?Last weight 171 kg ?Recommend weight loss outpatient with regular physical activity and healthy dieting. ? ? ? ?DVT prophylaxis: SCDs/early ambulation. ? ?Code Status: Full code ? ?Family Communication: His wife at bedside ? ?Disposition Plan: Admitted to progressive unit ? ?Consults called:  None ? ?Admission status: Observation status. ? ? ?Status is: Observation ? ? ? ?Kayleen Memos MD ?[facility] ?Pager [phone] ? ?If 7PM-7AM, please contact night-coverage ?[email or web address] ?Password [email or web address] ? ?12/20/2021, 2:57 AM   ?

## 2021-12-20 NOTE — ED Provider Notes (Signed)
?[facility] EMERGENCY DEPARTMENT ?Provider Note ? ? ?CSN: [id number] ?Arrival date & time: 12/20/21  0020 ? ?  ? ?History ? ?Chief Complaint - allergic reaction ? ?Level 5 caveat due to acuity of condition ?[name] is a 66 y.o. male. ? ?The history is provided by the patient and the spouse.  ? ?Patient presents with presumed allergic reaction.  Patient reports he ate shrimp earlier in the day, but 6 hours later began having tongue swelling he has no other symptoms.  No rash or itching.  No shortness of breath.  No vomiting or diarrhea.  He has never had this reaction before. ?Patient is also on lisinopril twice daily per family ? ?Home Medications ?Prior to Admission medications   ?Medication Sig Start Date End Date Taking? Authorizing Provider  ?acetaminophen (TYLENOL) 500 MG tablet Take 1 tablet (500 mg total) by mouth every 6 (six) hours as needed for mild pain or moderate pain. 12/09/16   Everlene Farrier, PA-C  ?aspirin 325 MG tablet Take 325 mg by mouth daily.    [provider]  ?atorvastatin (LIPITOR) 40 MG tablet Take 40 mg by mouth.  11/30/16   [provider]  ?ciprofloxacin (CIPRO) 500 MG tablet Take 1 tablet (500 mg total) by mouth every 12 (twelve) hours. 03/26/18   Hedges, Tinnie Gens, PA-C  ?furosemide (LASIX) 40 MG tablet Take 40 mg by mouth daily. 02/17/17   [provider]  ?KLOR-CON M20 20 MEQ tablet Take 20 mEq by mouth daily. 02/09/17   [provider]  ?lisinopril (PRINIVIL,ZESTRIL) 10 MG tablet Take 10 mg by mouth daily. 02/07/17   [provider]  ?metoprolol tartrate (LOPRESSOR) 25 MG tablet Take 25 mg by mouth 2 (two) times daily.    [provider]  ?pantoprazole (PROTONIX) 40 MG tablet Take 40 mg by mouth daily.  12/07/16   [provider]  ?XIGDUO XR 12-998 MG TB24 Take 2 tablets by mouth daily. 02/10/17   [provider]  ?   ? ?Allergies    ?Patient has no known allergies.   ? ?Review of Systems   ?Review  of Systems  ?Unable to perform ROS: Acuity of condition  ? ?Physical Exam ?Updated Vital Signs ?BP 138/83   Pulse (!) 55   Temp 98.4 ?F (36.9 ?C) (Oral)   Resp 17   SpO2 99%  ?Physical Exam ?CONSTITUTIONAL: Anxious, chronically ill-appearing ?HEAD: Normocephalic/atraumatic ?EYES: EOMI/PERRL ?ENMT: Large edematous tongue mostly on the right.  I am able to visualize uvula and oropharynx.  Dysphonia is noted ?NECK: supple no meningeal signs ?CV: S1/S2 noted, no murmurs/rubs/gallops noted ?LUNGS: Lungs are clear to auscultation bilaterally, no apparent distress ?ABDOMEN: soft, nontender, obese ?NEURO: Pt is awake/alert/appropriate, moves all extremitiesx4.  No facial droop.   ?EXTREMITIES: full ROM ?SKIN: warm, color normal ?PSYCH: Anxious ?ED Results / Procedures / Treatments   ?Labs ?(all labs ordered are listed, but only abnormal results are displayed) ?Labs Reviewed  ?CBC - Abnormal; Notable for the following components:  ?    Result Value  ? WBC 15.2 (*)   ? Hemoglobin 12.9 (*)   ? RDW 17.5 (*)   ? Platelets 453 (*)   ? All other components within normal limits  ?BASIC METABOLIC PANEL - Abnormal; Notable for the following components:  ? Glucose, Bld 128 (*)   ? BUN 26 (*)   ? Creatinine, Ser 1.29 (*)   ? All other components within normal limits  ? ? ?EKG ?None ? ?  Radiology ?No results found. ? ?Procedures ?[facility] Kitchen[facility] Care ?Performed by: Zadie Rhine, MD ?Authorized by: Zadie Rhine, MD  ? ?Critical care provider statement:  ?  Critical care time (minutes):  82 ?  Critical care start time:  12/20/2021 12:40 AM ?  Critical care end time:  12/20/2021 2:02 AM ?  Critical care time was exclusive of:  Separately billable procedures and treating other patients ?  Critical care was necessary to treat or prevent imminent or life-threatening deterioration of the following conditions:  Respiratory failure (allergic reaction) ?  Critical care was time spent personally by me on the following activities:  Obtaining  history from patient or surrogate, examination of patient, evaluation of patient's response to treatment, re-evaluation of patient's condition, ordering and review of laboratory studies, pulse oximetry and ordering and performing treatments and interventions ?  I assumed direction of critical care for this patient from another provider in my specialty: no   ?  Care discussed with: admitting provider    ? ? ?Medications Ordered in ED ?Medications  ?diphenhydrAMINE (BENADRYL) 50 MG/ML injection (0 mg  Hold 12/20/21 0040)  ?methylPREDNISolone sodium succinate (SOLU-MEDROL) 125 mg/2 mL injection (125 mg  Given 12/20/21 0040)  ?EPINEPHrine (EPI-PEN) 0.3 mg/0.3 mL injection ( Intramuscular Given 12/20/21 0033)  ?famotidine (PEPCID) 20-0.9 MG/50ML-% IVPB (0 mg  Stopped 12/20/21 0127)  ? ? ?ED Course/ Medical Decision Making/ A&P ?Clinical Course as of 12/20/21 0250  ?Wed Dec 20, 2021  ?0106 WBC(!): 15.2 ?Leukocytosis [DW]  ?0106 Patient stable, no worsening of his angioedema.  We will continue to monitor [DW]  ?0125 BUN(!): 26 ?Renal insufficiency [DW]  ?0158 Patient with some improvement of the angioedema.  No distress noted.  We will continue to monitor [DW]  ?0244 Pt improved,angioedema is still improving.  He is protecting his airway.  Plan to admit for further monitoring ? [DW]  ?[phone] Patient and wife were informed that he needs to stop his lisinopril [DW]  ?0249 Discussed with Dr. Margo Aye for admission [DW]  ?  ?Clinical Course User Index ?[DW] Zadie Rhine, MD  ? ?                        ?Medical Decision Making ?Amount and/or Complexity of Data Reviewed ?Labs: ordered. Decision-making details documented in ED Course. ? ?Risk ?Decision regarding hospitalization. ? ? ?This patient presents to the ED for concern of angioedema, this involves an extensive number of treatment options, and is a complaint that carries with it a high risk of complications and morbidity.  The differential diagnosis includes but is not limited  to anaphylaxis, ACE inhibitor induced angioedema ? ?Comorbidities that complicate the patient evaluation: ?Patient?s presentation is complicated by their history of obesity ? ? ? ?Additional history obtained: ?Additional history obtained from family ? ? ?Lab Tests: ?I Ordered, and personally interpreted labs.  The pertinent results include: Leukocytosis ?  ? ?Cardiac Monitoring: ?The patient was maintained on a cardiac monitor.  I personally viewed and interpreted the cardiac monitor which showed an underlying rhythm of:  sinus rhythm ? ?Medicines ordered and prescription drug management: ?I ordered medication including epipnephrine  for angioedema  ?Reevaluation of the patient after these medicines showed that the patient    improved ? ? ?Critical Interventions: ? ?EpiPen ? ?Consultations Obtained: ?I requested consultation with the admitting physician dr hall , and discussed  findings as well as pertinent plan - they recommend: admit to stepdown ? ?Reevaluation: ?After the interventions noted  above, I reevaluated the patient and found that they have :improved ? ?Complexity of problems addressed: ?Patient?s presentation is most consistent with  acute presentation with potential threat to life or bodily function ? ?Disposition: ?After consideration of the diagnostic results and the patient?s response to treatment,  ?I feel that the patent would benefit from admission   .  ? ? ? ? ? ? ? ? ? ?Final Clinical Impression(s) / ED Diagnoses ?Final diagnoses:  ?Angioedema, initial encounter  ? ? ?Rx / DC Orders ?ED Discharge Orders   ? ? None  ? ?  ? ? ?  ?Zadie RhineWickline, Yasha Tibbett, MD ?12/20/21 0250 ? ?

## 2021-12-20 NOTE — ED Notes (Signed)
Breakfast order placed ?

## 2021-12-20 NOTE — ED Triage Notes (Signed)
Pt arrives w/ tongue swelling. Per pt's wife they had shrimp fried rice for dinner, no known shellfish allergy. Pt denies itching or difficulty breathing, but has tongue swelling w/ drooling. ?

## 2021-12-20 NOTE — Discharge Summary (Signed)
?Physician Discharge Summary  ?Oscar Day WUJ:[id number] DOB: 09-Jan-1956 DOA: 12/20/2021 ? ?PCP: Fleet ContrasAvbuere, Edwin, MD ? ?Admit date: 12/20/2021 ?Discharge date: 12/20/2021 ? ?Admitted From: Home ?Disposition: Home ? ?Recommendations for Outpatient Follow-up:  ?Follow up with PCP in 1-2 weeks ?Discontinue lisinopril for concerns of ACEi induced angioedema ?Continue prednisone taper, Pepcid, Benadryl as needed ? ?Home Health: No ?Equipment/Devices: None ? ?Discharge Condition: Stable ?CODE STATUS: Full code ?Diet recommendation: Heart healthy/consistent carb regular diet ? ?History of present illness: ? ?Oscar Lookdward T Stille is a 66 y.o. male with medical history significant for obesity, coronary artery disease status post CABG, hyperlipidemia, hypertension on lisinopril twice daily who [facility] with complaints of tongue swelling and drooling, 6 hours after eating shrimp fried rice for dinner.  He has eaten shrimps in the past and has never had this reaction.  Patient has no known shellfish allergy.  He denies a rash, itching or difficulty breathing.  He was given 2 tablets of benadryl by his wife at home but the swelling worsened.  No prior similar allergic reaction.  Brought in to the ED via private vehicle by his wife.  ?  ?ED Course:  Tmax 98.4.  BP 134/71, pulse 66, respiratory 10, saturation 99% on room air.  Lab studies remarkable for serum glucose 128, BUN 26, creatinine 1.29, at baseline.  WBC 15.2, hemoglobin 12.9, MCV 82, platelet count 453. Due to concern for ACE inhibitor induced angioedema, he was treated with a dose of EpiPen injection, 1 dose of IV Benadryl, and 1 dose of IV Pepcid.  No stridors or wheezing on exam.  Patient is on lisinopril twice a day per his wife at bedside.  TRH, hospitalist service, was asked to admit for close observation to Progressive/Stepdown unit. ? ?Hospital course: ? 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  No rash or itching. Patient is on lisinopril twice daily with concern of ACE inhibitor induced angioedema.  Patient received dose of EpiPen in the ED followed by Benadryl 25 mg IV x1, IV Solu-Medrol 80 mg x 1, IV Pepcid 20 mg IV x 1 with significant improvement of symptoms.  Patient now believes his tongue swelling is back to normal.  Lisinopril added to list of allergies.  Will discharge on prednisone taper, Pepcid, Benadryl as needed.  Outpatient follow-up with PCP.  Prescription for EpiPen given. ? ?Leukocytosis and thrombocytosis, suspect reactive in the setting of angioedema ?Patient nonseptic in appearance and afebrile.  Likely reactive in the setting of angioedema.  No antibiotics indicated at this time. ?  ?Essential hypertension ?Discontinue lisinopril as above.  Continue amlodipine, hydrochlorothiazide, furosemide ?  ?Type 2 diabetes with hyperglycemia ?Hemoglobin A1c 7.7, fairly well controlled.  Continue metformin.  ?  ?Coronary artery disease status post CABG ?No anginal symptoms reported. ?Resume home aspirin and Lipitor. ?Closely monitor on telemetry ?  ?Hyperlipidemia ?Continue atorvastatin 40 mg p.o. daily ?  ?GERD ?Continue Protonix 40 mg p.o. daily ?  ?Morbid obesity ?Last weight 171 kg discussed with patient needs for aggressive lifestyle changes/weight loss as this complicates all facets of care.  Outpatient follow-up with PCP.   ? ? ? ? ? ?Discharge Diagnoses:  ?Principal Problem: ?  Angioedema ? ? ? ?Discharge Instructions ? ?Discharge Instructions   ? ? Call MD for:  difficulty breathing, headache or visual disturbances   Complete by: As directed ?  ? Call MD for:  extreme fatigue   Complete by: As directed ?  ? Call MD for:  persistant dizziness or light-headedness   Complete by: As directed ?  ? Call MD for:  persistant nausea and vomiting   Complete by: As directed ?  ? Call MD for:  severe uncontrolled pain   Complete by: As  directed ?  ? Call MD for:  temperature >100.4   Complete by: As directed ?  ? Diet - low sodium heart healthy   Complete by: As directed ?  ? Increase activity slowly   Complete by: As directed ?  ? Instruct patient to administer EpiPen and call 911 if symptoms of a serious allergic reaction.   Complete by: As directed ?  ? ?  ? ?Allergies as of 12/20/2021   ? ?   Reactions  ? Lisinopril Swelling  ? ?  ? ?  ?Medication List  ?  ? ?STOP taking these medications   ? ?cetirizine 10 MG tablet ?Commonly known as: ZYRTEC ?  ?ciprofloxacin 500 MG tablet ?Commonly known as: CIPRO ?  ?Jardiance 25 MG Tabs tablet ?Generic drug: empagliflozin ?  ?Klor-Con M20 20 MEQ tablet ?Generic drug: potassium chloride SA ?  ?lisinopril 10 MG tablet ?Commonly known as: ZESTRIL ?  ?lisinopril-hydrochlorothiazide 20-12.5 MG tablet ?Commonly known as: ZESTORETIC ?  ?Xigduo XR 12-998 MG Tb24 ?Generic drug: Dapagliflozin-metFORMIN HCl ER ?  ? ?  ? ?TAKE these medications   ? ?acetaminophen 500 MG tablet ?Commonly known as: TYLENOL ?Take 1 tablet (500 mg total) by mouth every 6 (six) hours as needed for mild pain or moderate pain. ?  ?amLODipine 5 MG tablet ?Commonly known as: NORVASC ?Take 5 mg by mouth daily. ?  ?aspirin 325 MG tablet ?Take 325 mg by mouth daily. ?  ?atorvastatin 40 MG tablet ?Commonly known as: LIPITOR ?Take 40 mg by mouth. ?  ?diphenhydrAMINE 25 MG tablet ?Commonly known as: Benadryl Allergy ?Take 1 tablet (25 mg total) by mouth every 6 (six) hours as needed for itching or allergies. ?  ?EPINEPHrine 0.3 mg/0.3 mL Soaj injection ?Commonly known as: EPI-PEN ?Inject 0.3 mg into the muscle as needed for anaphylaxis. ?  ?escitalopram 20 MG tablet ?Commonly known as: LEXAPRO ?Take 20 mg by mouth daily. ?  ?famotidine 20 MG tablet ?Commonly known as: PEPCID ?Take 1 tablet (20 mg total) by mouth 2 (two) times daily for 7 days. ?  ?furosemide 40 MG tablet ?Commonly known as: LASIX ?Take 40 mg by mouth daily. ?  ?hydrochlorothiazide  12.5 MG capsule ?Commonly known as: MICROZIDE ?Take 1 capsule (12.5 mg total) by mouth daily. ?  ?metFORMIN 1000 MG tablet ?Commonly known as: GLUCOPHAGE ?Take 1,000 mg by mouth 2 (two) times daily. ?  ?metoprolol tartrate 25 MG tablet ?Commonly known as: LOPRESSOR ?Take 25 mg by mouth 2 (two) times daily. ?  ?pantoprazole 40 MG tablet ?Commonly known as: PROTONIX ?Take 40 mg by mouth daily. ?  ?predniSONE 10 MG tablet ?Commonly known as: DELTASONE ?Take 4 tablets (40 mg total) by mouth daily for 1 day, THEN 3 tablets (30 mg total) daily for 1 day, THEN 2 tablets (20 mg total) daily for 1 day, THEN 1 tablet (10 mg total) daily for 1 day. ?Start taking on: December 20, 2021 ?  ?Vitamin D (Ergocalciferol) 1.25 MG (50000 UNIT) Caps capsule ?Commonly known as: DRISDOL ?Take 50,000 Units by mouth once a week. ?  ? ?  ? ? Follow-up Information   ? ? Fleet Contras,  MD. Schedule an appointment as soon as possible for a visit in 1 week(s).   ?Specialty: Internal Medicine ?Contact information: ?[address] ?[place] [address] ?[phone] ? ? ?  ?  ? ?  ?  ? ?  ? ?Allergies  ?Allergen Reactions  ? Lisinopril Swelling  ? ? ?Consultations: ?None ? ? ?Procedures/Studies: ?No results found. ? ? ?Subjective: ?Patient seen examined bedside, resting comfortably.  Spouse present.  Patient reports that his tongue swelling has now resolved.  No issues with shortness of breath, no chest pain, able to tolerate diet.  Feels ready for discharge home.  Discussed discontinuation of lisinopril.  Will discharge on prednisone taper, Pepcid and Benadryl as needed.  Recommend outpatient follow-up with PCP within 1 week.  No other questions or concerns at this time.  Denies headache, no dizziness, no chest pain, no palpitations, no fever/chills/night sweats, no nausea/vomiting/diarrhea, no weakness, no fatigue, no shortness of breath, no difficulty swallowing, no paresthesias.  No acute events overnight per nursing staff. ? ?Discharge  Exam: ?Vitals:  ? 12/20/21 0700 12/20/21 0715  ?BP: 135/85 (!) 148/91  ?Pulse: (!) 53 (!) 59  ?Resp: 18 18  ?Temp:    ?SpO2: 94% 97%  ? ?Vitals:  ? 12/20/21 0615 12/20/21 0630 12/20/21 0700 12/20/21 0715  ?BP:

## 2022-05-18 ENCOUNTER — Other Ambulatory Visit: Payer: Self-pay | Admitting: Internal Medicine

## 2022-05-18 DIAGNOSIS — E7849 Other hyperlipidemia: Secondary | ICD-10-CM | POA: Diagnosis not present

## 2022-05-18 DIAGNOSIS — Z0000 Encounter for general adult medical examination without abnormal findings: Secondary | ICD-10-CM | POA: Diagnosis not present

## 2022-05-18 DIAGNOSIS — E559 Vitamin D deficiency, unspecified: Secondary | ICD-10-CM | POA: Diagnosis not present

## 2022-05-18 DIAGNOSIS — Z125 Encounter for screening for malignant neoplasm of prostate: Secondary | ICD-10-CM | POA: Diagnosis not present

## 2022-05-19 LAB — LIPID PANEL
Cholesterol: 189 mg/dL (ref <200)
HDL: 53 mg/dL (ref >=40)
LDL Cholesterol (Calc): 122 mg/dL (calc) — ABNORMAL HIGH
Non-HDL Cholesterol (Calc): 136 mg/dL (calc) — ABNORMAL HIGH (ref <130)
Total CHOL/HDL Ratio: 3.6 (calc) (ref <5.0)
Triglycerides: 57 mg/dL (ref <150)

## 2022-05-19 LAB — COMPLETE METABOLIC PANEL WITH GFR
AG Ratio: 1.1 (calc) (ref 1.0–2.5)
ALT: 10 U/L (ref 9–46)
AST: 12 U/L (ref 10–35)
Albumin: 3.6 g/dL (ref 3.6–5.1)
Alkaline phosphatase (APISO): 84 U/L (ref 35–144)
BUN: 25 mg/dL (ref 7–25)
CO2: 23 mmol/L (ref 20–32)
Calcium: 9.3 mg/dL (ref 8.6–10.3)
Chloride: 103 mmol/L (ref 98–110)
Creat: 1.1 mg/dL (ref 0.70–1.35)
Globulin: 3.3 g/dL (calc) (ref 1.9–3.7)
Glucose, Bld: 130 mg/dL — ABNORMAL HIGH (ref 65–99)
Potassium: 4.9 mmol/L (ref 3.5–5.3)
Sodium: 142 mmol/L (ref 135–146)
Total Bilirubin: 0.5 mg/dL (ref 0.2–1.2)
Total Protein: 6.9 g/dL (ref 6.1–8.1)
eGFR: 75 mL/min/{1.73_m2} (ref >=60)

## 2022-05-19 LAB — CBC
HCT: 37.4 % — ABNORMAL LOW (ref 38.5–50.0)
Hemoglobin: 12 g/dL — ABNORMAL LOW (ref 13.2–17.1)
MCH: 25.7 pg — ABNORMAL LOW (ref 27.0–33.0)
MCHC: 32.1 g/dL (ref 32.0–36.0)
MCV: 80.1 fL (ref 80.0–100.0)
MPV: 9.1 fL (ref 7.5–12.5)
Platelets: 416 10*3/uL — ABNORMAL HIGH (ref 140–400)
RBC: 4.67 10*6/uL (ref 4.20–5.80)
RDW: 16.7 % — ABNORMAL HIGH (ref 11.0–15.0)
WBC: 13.7 10*3/uL — ABNORMAL HIGH (ref 3.8–10.8)

## 2022-05-19 LAB — FOLATE: Folate: 10.8 ng/mL

## 2022-05-19 LAB — PSA: PSA: 0.17 ng/mL (ref <=4.00)

## 2022-05-19 LAB — VITAMIN B12: Vitamin B-12: 398 pg/mL (ref 200–1100)

## 2022-05-19 LAB — TSH: TSH: 0.96 mIU/L (ref 0.40–4.50)

## 2022-05-19 LAB — VITAMIN D 25 HYDROXY (VIT D DEFICIENCY, FRACTURES): Vit D, 25-Hydroxy: 98 ng/mL (ref 30–100)

## 2022-06-06 ENCOUNTER — Ambulatory Visit: Payer: 59 | Admitting: Neurology

## 2022-06-06 ENCOUNTER — Encounter: Payer: Self-pay | Admitting: Neurology

## 2022-06-06 ENCOUNTER — Telehealth: Payer: Self-pay | Admitting: Neurology

## 2022-06-06 VITALS — BP 123/69 | HR 66 | Ht 73.0 in | Wt 337.0 lb

## 2022-06-06 DIAGNOSIS — G4733 Obstructive sleep apnea (adult) (pediatric): Secondary | ICD-10-CM | POA: Diagnosis not present

## 2022-06-06 DIAGNOSIS — R251 Tremor, unspecified: Secondary | ICD-10-CM

## 2022-06-06 DIAGNOSIS — Z951 Presence of aortocoronary bypass graft: Secondary | ICD-10-CM | POA: Diagnosis not present

## 2022-06-06 NOTE — Telephone Encounter (Signed)
[employer] sent to [facility] they obtain auth [id number] 

## 2022-06-06 NOTE — Patient Instructions (Addendum)
It was nice to meet you both today.  You have a tremor of the hands, I have noticed it particularly in the right hand, a so-called resting tremor primarily, other physical findings are not enough to justify the diagnosis of Parkinson's disease or parkinsonism. We will continue to monitor your exam and tremor. We will not start any medication for tremor at this time. Please remember, that any kind of tremor may be exacerbated by anxiety, anger, nervousness, excitement, dehydration, sleep deprivation, thyroid dysfunction, by caffeine, and low blood sugar values or blood sugar fluctuations. Some medications can exacerbate tremors, this includes certain asthma or COPD medications and certain antidepressants.    Based on your symptoms and your exam I believe you are still at risk for obstructive sleep apnea (aka OSA). We should proceed with a sleep study to determine whether you do or do not have OSA and how severe it is. Even, if you have mild OSA, I may want you to consider treatment with CPAP, as treatment of even borderline or mild sleep apnea can result and improvement of symptoms such as sleep disruption, daytime sleepiness, nighttime bathroom breaks, restless leg symptoms, improvement of headache syndromes, even improved mood disorder.   As explained, an attended sleep study (meaning you get to stay overnight in the sleep lab), lets [place] monitor sleep-related behaviors such as sleep talking and leg movements in sleep, in addition to monitoring for sleep apnea.  A home sleep test is a screening tool for sleep apnea diagnosis only, but unfortunately, does not help with any other sleep-related diagnoses.  Please remember, the long-term risks and ramifications of untreated moderate to severe obstructive sleep apnea may include (but are not limited to): increased risk for cardiovascular disease, including congestive heart failure, stroke, difficult to control hypertension, treatment resistant obesity,  arrhythmias, especially irregular heartbeat commonly known as A. Fib. (atrial fibrillation); even type 2 diabetes has been linked to untreated OSA.    As discussed, I will order a brain MRI and a sleep study at this time.  We will plan a follow-up after testing.

## 2022-06-06 NOTE — Progress Notes (Signed)
Subjective:    Patient ID: Oscar Day is a 66 y.o. male.  HPI    Star Age, MD, PhD [facility] [address], [address] [place], [place] [address]  Dear Dr. Jeanie Cooks,   I saw your patient, Oscar Day, upon your kind request, in my Neurologic clinic today for initial consultation of his.  The patient is accompanied by his wife today. As you know, Oscar Day is a 55 year old gentleman with an underlying medical history of coronary artery disease, with status post 5 vessel bypass, hypertension, sleep apnea, diabetes, and morbid obesity with a BMI of over 45, who reports an approximately 1-1/2-year history of hand tremors, particularly in the thumb areas bilaterally, worse on the right side per patient.  I reviewed your office note from 05/18/2022.  He had recent lab work on 05/18/2022 and I reviewed the results: Vitamin D 98, lipid panel showed total cholesterol level of 189, HDL 53, triglycerides 57, LDL 122, CMP showed glucose elevated at 130, BUN 25, creatinine 1.1, sodium 142, potassium 4.9, alk phos 84, AST 12, ALT 10, TSH normal at 0.96, CBC without differential showed elevated WBC at 13.7, hemoglobin below normal at 12, hematocrit below normal at 37.4, platelets elevated at 416, vitamin B12 on the lower end of the spectrum at 398, folate normal at 10.8, PSA normal at 0.17. He has previously seen a neurologist, at [facility] neurological care by Dr. Lily Kocher on 02/15/2022 and I reviewed the office visit note.  A MRI brain without contrast was ordered but apparently not done due to insurance approval as understand.  He was not started on any new medications at the time.  Per wife, he was not felt to have any Parkinson's disease at the time.   Of note, he is on a beta-blocker currently, metoprolol 25 mg twice daily.  He also takes Lexapro 20 mg daily.   She reports that she has noticed that the tremor tends to be worse at the end of the day, when he is fatigued,  or when he has not regular schedule.  She works 3 days a week and then he tends to not eat on time on those days.  He has had blood sugar fluctuation and latest A1c was above 7 per wife.  He does not sleep very well.  He has a prior diagnosis of sleep apnea but has not used his CPAP in many years.  He could not get comfortable with the machine.  His machine is over 58 years old.  Epworth sleepiness score is 10 out of 24 today.   He has not fallen except once in the recent past, he tripped over a pair of shoes as he was getting out of bed. Tremor affects both hands, worse on the dominant side.    He denies a family history of tremors, mom lived to be 34, she had dementia and had tremors.  Brother has dementia and has hand tremors as well.  He has only 1 brother.  He drinks caffeine in the form of coffee, usually 1 cup/day and 2 glasses of tea per day.  His Past Medical History Is Significant For: Past Medical History:  Diagnosis Date   CAD (coronary artery disease)    Diabetes mellitus without complication ([place])    Hypertension    Obesity    Sleep apnea     His Past Surgical History Is Significant For: Past Surgical History:  Procedure Laterality Date   CORONARY ARTERY BYPASS GRAFT  2010    His Family History Is Significant For: Family History  Problem Relation Age of Onset   Tremor Mother    CAD Brother    Tremor Brother     His Social History Is Significant For: Social History   Socioeconomic History   Marital status: Married    Spouse name: Not on file   Number of children: Not on file   Years of education: Not on file   Highest education level: Not on file  Occupational History   Not on file  Tobacco Use   Smoking status: Every Day    Packs/day: 1.00    Types: Cigarettes   Smokeless tobacco: Never  Substance and Sexual Activity   Alcohol use: Yes    Comment: occ   Drug use: No   Sexual activity: Not on file  Other Topics Concern   Not on file  Social History  Narrative   Not on file   Social Determinants of Health   Financial Resource Strain: Not on file  Food Insecurity: Not on file  Transportation Needs: Not on file  Physical Activity: Not on file  Stress: Not on file  Social Connections: Not on file    His Allergies Are:  Allergies  Allergen Reactions   Lisinopril Swelling  :   His Current Medications Are:  Outpatient Encounter Medications as of 06/06/2022  Medication Sig   acetaminophen (TYLENOL) 500 MG tablet Take 1 tablet (500 mg total) by mouth every 6 (six) hours as needed for mild pain or moderate pain.   amLODipine (NORVASC) 5 MG tablet Take 5 mg by mouth daily.   aspirin 325 MG tablet Take 325 mg by mouth daily.   atorvastatin (LIPITOR) 40 MG tablet Take 40 mg by mouth.    diphenhydrAMINE (BENADRYL ALLERGY) 25 MG tablet Take 1 tablet (25 mg total) by mouth every 6 (six) hours as needed for itching or allergies.   EPINEPHrine 0.3 mg/0.3 mL IJ SOAJ injection Inject 0.3 mg into the muscle as needed for anaphylaxis.   escitalopram (LEXAPRO) 20 MG tablet Take 20 mg by mouth daily.   furosemide (LASIX) 40 MG tablet Take 40 mg by mouth daily.   metFORMIN (GLUCOPHAGE) 1000 MG tablet Take 1,000 mg by mouth 2 (two) times daily.   metoprolol tartrate (LOPRESSOR) 25 MG tablet Take 25 mg by mouth 2 (two) times daily.   pantoprazole (PROTONIX) 40 MG tablet Take 40 mg by mouth daily.    Vitamin D, Ergocalciferol, (DRISDOL) 1.25 MG (50000 UNIT) CAPS capsule Take 50,000 Units by mouth once a week.   famotidine (PEPCID) 20 MG tablet Take 1 tablet (20 mg total) by mouth 2 (two) times daily for 7 days.   hydrochlorothiazide (MICROZIDE) 12.5 MG capsule Take 1 capsule (12.5 mg total) by mouth daily.   No facility-administered encounter medications on file as of 06/06/2022.  :   Review of Systems:  Out of a complete 14 point review of systems, all are reviewed and negative with the exception of these symptoms as listed below:  Review of  Systems  Neurological:        Pt here fior tremors  Pt states tremors are in both hands Pt states tremors increase when he is hungry .     Objective:  Neurological Exam  Physical Exam Physical Examination:   Vitals:   06/06/22 0943  BP: 123/69  Pulse: 66    General Examination: The patient is a very pleasant 66 y.o. male in no acute  distress. He appears well-developed and well-nourished and well groomed.   HEENT: Normocephalic, atraumatic, pupils are equal, round and reactive to light, extraocular tracking is good without limitation to gaze excursion or nystagmus noted. Hearing is grossly intact. Face is symmetric with normal facial animation to perhaps slightly decreased eye blink rate.  Speech is clear, no dysarthria, no hypophonia.  Neck is supple, no nuchal rigidity.  No head or neck tremor.  No lip or jaw tremor.  No carotid bruits.  Airway examination reveals moderate mouth dryness, moderate airway crowding secondary to Mallampati class III, small airway entry, lumbar spine.  Neck circumference 18 three-quarter inches.   Chest: Clear to auscultation without wheezing, rhonchi or crackles noted.  Heart: S1+S2+0, regular and normal without murmurs, rubs or gallops noted.   Abdomen: Soft, non-tender and non-distended.  Extremities: There is swelling in the distal lower extremities bilaterally.    Skin: Warm and dry without trophic changes noted.   Musculoskeletal: exam reveals no obvious joint deformities.   Neurologically:  Mental status: The patient is awake, alert and oriented in all 4 spheres. His immediate and remote memory, attention, language skills and fund of knowledge are appropriate. There is no evidence of aphasia, agnosia, apraxia or anomia. Speech is clear with normal prosody and enunciation. Thought process is linear. Mood is normal and affect is normal.  Cranial nerves II - XII are as described above under HEENT exam.  Motor exam: Normal bulk, strength and tone  is noted. There is no obvious action tremor today.  He has a very intermittent and slight resting tremor in the right thumb area only.  No lower extremity tremor, no significant postural tremor, very slight action tremor in the right more than left upper extremity.    On 06/06/2022: on Archimedes spiral drawing he has insecurity with the left hand, no trembling of the right hand or left hand, handwriting is legible, not tremulous, not micrographic.   Fine motor skills and coordination: Globally very mildly impaired with finger taps, hand movements and rapid alternating patting in the upper extremities, foot taps mildly impaired, maybe slightly more noticeable on the right. Cerebellar testing: No dysmetria or intention tremor. There is no truncal or gait ataxia.  Sensory exam: intact to light touch in the upper and lower extremities.  Gait, station and balance: He stands with difficulty and pushes himself up.  Does not require any assistance, posture is mildly stooped for age.  He walks slowly and cautiously, no walking aids.  Fairly preserved arm swing bilaterally.  No shuffling.  Assessment and Plan:  In summary, Oscar Day is a very pleasant 66 y.o.-year old male with an underlying medical history of coronary artery disease, with status post 5 vessel bypass, hypertension, sleep apnea, diabetes, and morbid obesity with a BMI of over 45, who presents for evaluation of his tremor disorder of approximately 18 months duration.  History and examination are supportive of essential tremor, findings are on the rather mild side at this time.  He does have a mild resting tremor in the right upper extremity, very slight discrepancy between right and left fine motor exam, something we should continue to monitor.  His history and examination are not telltale for parkinsonism or Parkinson's disease.  I had a long discussion with the patient and his wife, we talked about symptomatic treatment options and supportive  treatments, triggers for tremors as well.  He is advised to try to hydrate well, continue to pursue weight loss, continue to try  to sleep well but also pursue reevaluation of his obstructive sleep apnea.  Sleeping better with more consolidated sleep and more restful sleep may result in better tremor control as a secondary effect.  We can consider symptomatic medication in the near future if need be.  We mutually agreed to pursue with a brain MRI without contrast to look for any obvious structural cause of his tremors.  We will plan a follow-up after sleep testing and the MRI and we will keep him posted as to the results as well.  I answered questions today and the patient and his wife were in agreement.  Thank you very much for allowing me to participate in the care of this nice patient. If I can be of any further assistance to you please do not hesitate to call me at [phone].  Sincerely,   Star Age, MD, PhD  This was an extended visit of over 1 hour.

## 2022-06-18 ENCOUNTER — Ambulatory Visit: Payer: 59 | Admitting: Neurology

## 2022-07-05 ENCOUNTER — Telehealth: Payer: Self-pay | Admitting: Neurology

## 2022-07-05 NOTE — Telephone Encounter (Signed)
[employer] pending uploaded notes  

## 2022-08-06 NOTE — Telephone Encounter (Signed)
NPSG- Monia Pouch Berkley Harvey: [id number] (exp. 07/05/22 to 01/01/23)    Patient is scheduled at [facility] for 10/08/22 at 9 pm.  Mailed packet to the patient.

## 2022-09-04 NOTE — Telephone Encounter (Signed)
patient got new health insurance Oscar-pending with [employer]

## 2022-09-12 NOTE — Telephone Encounter (Signed)
Called [employer] to check the status. It is still pending.

## 2022-09-13 NOTE — Telephone Encounter (Signed)
His new health insurance [place] has denied the NPSG.  I called the patient but he did not pick up, I left him a voicemail informing him that his new insurance did deny the in lab and I was calling to schedule the home sleep study.  I left my direct number for him to call me to schedule his HST.

## 2022-09-20 NOTE — Telephone Encounter (Signed)
I spoke with the patient and informed him his insurance denied the NPSG. I asked about scheduling a HST but at this time he will call us back to schedule the HST due to he has a lot going on in his life.

## 2023-01-17 ENCOUNTER — Ambulatory Visit: Payer: Medicare HMO | Admitting: Podiatry

## 2023-01-17 DIAGNOSIS — B351 Tinea unguium: Secondary | ICD-10-CM

## 2023-01-17 DIAGNOSIS — M79675 Pain in left toe(s): Secondary | ICD-10-CM

## 2023-01-17 DIAGNOSIS — M79674 Pain in right toe(s): Secondary | ICD-10-CM | POA: Diagnosis not present

## 2023-01-17 NOTE — Progress Notes (Signed)
This patient presents to the office with chief complaint of long thick nails and diabetic feet.  This patient  says there  is  no pain and discomfort in their feet.  This patient says there are long thick painful nails.  These nails are painful walking and wearing shoes.  Patient has no history of infection or drainage from both feet.  Patient is unable to  self treat his own nails . This patient presents  to the office today for treatment of the  long nails and a foot evaluation due to history of  diabetes. He presents to the office with his wife.  General Appearance  Alert, conversant and in no acute stress.  Vascular  posterior tibial  pulses are palpable  bilaterally.  Dorsalis pedis pulses are non palpable due to swelling.Capillary return is within normal limits  bilaterally. Temperature is within normal limits  bilaterally.  Neurologic  Senn-Weinstein monofilament wire test within normal limits  bilaterally. Muscle power within normal limits bilaterally.  Nails Thick disfigured discolored nails with subungual debris  from hallux to fifth toes bilaterally. No evidence of bacterial infection or drainage bilaterally.  Orthopedic  No limitations of motion of motion feet .  No crepitus or effusions noted.  No bony pathology or digital deformities noted.  Skin  normotropic skin with no porokeratosis noted bilaterally.  No signs of infections or ulcers noted.   Dry scaly skin  B/L.  Onychomycosis  Diabetes with no foot complications  Xerosis  B/L.  IE  Debride nails x 10.  A diabetic foot exam was performed and there is no evidence of any vascular or neurologic pathology. Told him to use vaseline on his feet as well as consider parafin bath.  RTC 3 months.   Helane Gunther DPM

## 2023-04-18 ENCOUNTER — Encounter: Payer: Self-pay | Admitting: Podiatry

## 2023-04-18 ENCOUNTER — Ambulatory Visit: Payer: Medicare HMO | Admitting: Podiatry

## 2023-04-18 DIAGNOSIS — M79675 Pain in left toe(s): Secondary | ICD-10-CM

## 2023-04-18 DIAGNOSIS — B351 Tinea unguium: Secondary | ICD-10-CM

## 2023-04-18 DIAGNOSIS — M79674 Pain in right toe(s): Secondary | ICD-10-CM | POA: Diagnosis not present

## 2023-04-18 NOTE — Progress Notes (Signed)
This patient returns to my office for at risk foot care.  This patient requires this care by a professional since this patient will be at risk due to taking metformin.  This patient is unable to cut nails himself since the patient cannot reach his nails.These nails are painful walking and wearing shoes.  He presents to the office with his wife.This patient presents for at risk foot care today.  General Appearance  Alert, conversant and in no acute stress.  Vascular  Dorsalis pedis and posterior tibial  pulses are  weakly palpable  bilaterally.  Capillary return is within normal limits  bilaterally. Temperature is within normal limits  bilaterally.  Neurologic  Senn-Weinstein monofilament wire test within normal limits  bilaterally. Muscle power within normal limits bilaterally.  Nails Thick disfigured discolored nails with subungual debris  from hallux to fifth toes bilaterally. No evidence of bacterial infection or drainage bilaterally.  Orthopedic  No limitations of motion  feet .  No crepitus or effusions noted.  No bony pathology or digital deformities noted.  Skin  normotropic skin with no porokeratosis noted bilaterally.  No signs of infections or ulcers noted.     Onychomycosis  Pain in right toes  Pain in left toes  Consent was obtained for treatment procedures.   Mechanical debridement of nails 1-5  bilaterally performed with a nail nipper.  Filed with dremel without incident.    Return office visit    3 months                  Told patient to return for periodic foot care and evaluation due to potential at risk complications.   Helane Gunther DPM

## 2023-04-24 ENCOUNTER — Other Ambulatory Visit: Payer: Self-pay | Admitting: Internal Medicine

## 2023-04-25 LAB — TSH: TSH: 1 m[IU]/L (ref 0.40–4.50)

## 2023-04-25 LAB — CBC
HCT: 39 % (ref 38.5–50.0)
Hemoglobin: 12.4 g/dL — ABNORMAL LOW (ref 13.2–17.1)
MCH: 25.3 pg — ABNORMAL LOW (ref 27.0–33.0)
MCHC: 31.8 g/dL — ABNORMAL LOW (ref 32.0–36.0)
MCV: 79.6 fL — ABNORMAL LOW (ref 80.0–100.0)
MPV: 8.9 fL (ref 7.5–12.5)
Platelets: 380 10*3/uL (ref 140–400)
RBC: 4.9 10*6/uL (ref 4.20–5.80)
RDW: 16.2 % — ABNORMAL HIGH (ref 11.0–15.0)
WBC: 6.4 10*3/uL (ref 3.8–10.8)

## 2023-04-25 LAB — COMPLETE METABOLIC PANEL WITH GFR
AG Ratio: 1.2 (calc) (ref 1.0–2.5)
ALT: 16 U/L (ref 9–46)
AST: 20 U/L (ref 10–35)
Albumin: 3.9 g/dL (ref 3.6–5.1)
Alkaline phosphatase (APISO): 76 U/L (ref 35–144)
BUN/Creatinine Ratio: 20 (calc) (ref 6–22)
BUN: 29 mg/dL — ABNORMAL HIGH (ref 7–25)
CO2: 26 mmol/L (ref 20–32)
Calcium: 9.6 mg/dL (ref 8.6–10.3)
Chloride: 97 mmol/L — ABNORMAL LOW (ref 98–110)
Creat: 1.45 mg/dL — ABNORMAL HIGH (ref 0.70–1.35)
Globulin: 3.2 g/dL (ref 1.9–3.7)
Glucose, Bld: 134 mg/dL — ABNORMAL HIGH (ref 65–99)
Potassium: 4.7 mmol/L (ref 3.5–5.3)
Sodium: 139 mmol/L (ref 135–146)
Total Bilirubin: 0.9 mg/dL (ref 0.2–1.2)
Total Protein: 7.1 g/dL (ref 6.1–8.1)
eGFR: 53 mL/min/{1.73_m2} — ABNORMAL LOW (ref >=60)

## 2023-04-25 LAB — LIPID PANEL
Cholesterol: 210 mg/dL — ABNORMAL HIGH (ref <200)
HDL: 58 mg/dL (ref >=40)
LDL Cholesterol (Calc): 137 mg/dL — ABNORMAL HIGH
Non-HDL Cholesterol (Calc): 152 mg/dL — ABNORMAL HIGH (ref <130)
Total CHOL/HDL Ratio: 3.6 (calc) (ref <5.0)
Triglycerides: 63 mg/dL (ref <150)

## 2023-04-25 LAB — VITAMIN D 25 HYDROXY (VIT D DEFICIENCY, FRACTURES): Vit D, 25-Hydroxy: 102 ng/mL — ABNORMAL HIGH (ref 30–100)

## 2023-04-26 LAB — SARS-COV-2 RNA,(COVID-19) QUALITATIVE NAAT: SARS CoV2 RNA: DETECTED — AB

## 2023-07-03 ENCOUNTER — Ambulatory Visit: Payer: Medicare HMO | Admitting: Neurology

## 2023-07-03 ENCOUNTER — Encounter: Payer: Self-pay | Admitting: Neurology

## 2023-07-03 ENCOUNTER — Telehealth: Payer: Self-pay | Admitting: Neurology

## 2023-07-03 VITALS — BP 134/78 | HR 67 | Ht 75.0 in | Wt 343.3 lb

## 2023-07-03 DIAGNOSIS — Z9189 Other specified personal risk factors, not elsewhere classified: Secondary | ICD-10-CM | POA: Diagnosis not present

## 2023-07-03 DIAGNOSIS — G4733 Obstructive sleep apnea (adult) (pediatric): Secondary | ICD-10-CM

## 2023-07-03 DIAGNOSIS — R251 Tremor, unspecified: Secondary | ICD-10-CM | POA: Diagnosis not present

## 2023-07-03 NOTE — Progress Notes (Signed)
Subjective:    Patient ID: Oscar Day is a 67 y.o. male.  HPI    Interim history:  Oscar Day is a 67 year old gentleman with an underlying medical history of coronary artery disease, with status post 5 vessel bypass, hypertension, sleep apnea, diabetes, and morbid obesity with a BMI of over 40, who presents for follow-up consultation of his hand tremors.  The patient is accompanied by his wife today.  I first met him at the request of his primary care physician on 06/06/2022, at which time he reported a 1-1/2-year history of hand tremors, intermittently.  His examination was not in keeping with a significant tremor, we talked about tremor triggers at the time, he was reassured that there were no signs of parkinsonism.  He was advised to proceed with a brain MRI as well as a sleep study.  His laboratory attended sleep study was not approved by his insurance, he did not pursue a brain MRI or home sleep test at the time.  Today, 07/03/2023: He reports that his tremor has been worse, it is still intermittent, unfortunately, they have had increase in stress in the past year.  They lost their son earlier in the year.  This was acute and unexpected and he had significant increase in tremor at the time.  He is working on diabetes control.  He has been on Jardiance as well as metformin and is supposed to start Ozempic.  He limits his caffeine to 1 cup of coffee in the morning.  He stopped smoking in July 2024.  He fell in September but it was on the carpet and he did not hit his head or lose consciousness, no major injuries provide and he did not seek medical attention at the time.  He does try to drink water but could do a little better by self-report.  The patient's allergies, current medications, family history, past medical history, past social history, past surgical history and problem list were reviewed and updated as appropriate.   Previously:   reports an approximately 1-1/2-year history of hand  tremors, particularly in the thumb areas bilaterally, worse on the right side per patient.  I reviewed your office note from 05/18/2022.  He had recent lab work on 05/18/2022 and I reviewed the results: Vitamin D 98, lipid panel showed total cholesterol level of 189, HDL 53, triglycerides 57, LDL 122, CMP showed glucose elevated at 130, BUN 25, creatinine 1.1, sodium 142, potassium 4.9, alk phos 84, AST 12, ALT 10, TSH normal at 0.96, CBC without differential showed elevated WBC at 13.7, hemoglobin below normal at 12, hematocrit below normal at 37.4, platelets elevated at 416, vitamin B12 on the lower end of the spectrum at 398, folate normal at 10.8, PSA normal at 0.17. He has previously seen a neurologist, at [facility] neurological care by Dr. Pamala Hurry on 02/15/2022 and I reviewed the office visit note.  A MRI brain without contrast was ordered but apparently not done due to insurance approval as understand.  He was not started on any new medications at the time.  Per wife, he was not felt to have any Parkinson's disease at the time.     Of note, he is on a beta-blocker currently, metoprolol 25 mg twice daily.  He also takes Lexapro 20 mg daily.    She reports that she has noticed that the tremor tends to be worse at the end of the day, when he is fatigued, or when he has not regular schedule.  She  works 3 days a week and then he tends to not eat on time on those days.  He has had blood sugar fluctuation and latest A1c was above 7 per wife.  He does not sleep very well.  He has a prior diagnosis of sleep apnea but has not used his CPAP in many years.  He could not get comfortable with the machine.  His machine is over 80 years old.  Epworth sleepiness score is 10 out of 24 today.   He has not fallen except once in the recent past, he tripped over a pair of shoes as he was getting out of bed. Tremor affects both hands, worse on the dominant side.     He denies a family history of tremors, mom lived to be 37,  she had dementia and had tremors.  Brother has dementia and has hand tremors as well.  He has only 1 brother.  He drinks caffeine in the form of coffee, usually 1 cup/day and 2 glasses of tea per day.     His Past Medical History Is Significant For: Past Medical History:  Diagnosis Date   CAD (coronary artery disease)    Diabetes mellitus without complication (HCC)    Hypertension    Obesity    Sleep apnea     His Past Surgical History Is Significant For: Past Surgical History:  Procedure Laterality Date   CORONARY ARTERY BYPASS GRAFT  2010    His Family History Is Significant For: Family History  Problem Relation Age of Onset   Tremor Mother    CAD Brother    Tremor Brother     His Social History Is Significant For: Social History   Socioeconomic History   Marital status: Married    Spouse name: Not on file   Number of children: Not on file   Years of education: Not on file   Highest education level: Not on file  Occupational History   Not on file  Tobacco Use   Smoking status: Every Day    Current packs/day: 1.00    Types: Cigarettes   Smokeless tobacco: Never  Substance and Sexual Activity   Alcohol use: Yes    Comment: occ   Drug use: No   Sexual activity: Not on file  Other Topics Concern   Not on file  Social History Narrative   Not on file   Social Determinants of Health   Financial Resource Strain: Not on file  Food Insecurity: Not on file  Transportation Needs: Not on file  Physical Activity: Not on file  Stress: Not on file  Social Connections: Not on file    His Allergies Are:  Allergies  Allergen Reactions   Lisinopril Swelling  :   His Current Medications Are:  Outpatient Encounter Medications as of 07/03/2023  Medication Sig   acetaminophen (TYLENOL) 500 MG tablet Take 1 tablet (500 mg total) by mouth every 6 (six) hours as needed for mild pain or moderate pain.   amLODipine (NORVASC) 5 MG tablet Take 5 mg by mouth daily.    aspirin 325 MG tablet Take 325 mg by mouth daily.   atorvastatin (LIPITOR) 40 MG tablet Take 40 mg by mouth.    diphenhydrAMINE (BENADRYL ALLERGY) 25 MG tablet Take 1 tablet (25 mg total) by mouth every 6 (six) hours as needed for itching or allergies.   EPINEPHrine 0.3 mg/0.3 mL IJ SOAJ injection Inject 0.3 mg into the muscle as needed for anaphylaxis.   escitalopram (LEXAPRO)  20 MG tablet Take 20 mg by mouth daily.   furosemide (LASIX) 40 MG tablet Take 40 mg by mouth daily.   hydrochlorothiazide (MICROZIDE) 12.5 MG capsule Take 1 capsule (12.5 mg total) by mouth daily.   metFORMIN (GLUCOPHAGE) 1000 MG tablet Take 1,000 mg by mouth 2 (two) times daily.   metoprolol tartrate (LOPRESSOR) 25 MG tablet Take 25 mg by mouth 2 (two) times daily.   pantoprazole (PROTONIX) 40 MG tablet Take 40 mg by mouth daily.    Vitamin D, Ergocalciferol, (DRISDOL) 1.25 MG (50000 UNIT) CAPS capsule Take 50,000 Units by mouth once a week.   [DISCONTINUED] famotidine (PEPCID) 20 MG tablet Take 1 tablet (20 mg total) by mouth 2 (two) times daily for 7 days.   No facility-administered encounter medications on file as of 07/03/2023.  :  Review of Systems:  Out of a complete 14 point review of systems, all are reviewed and negative with the exception of these symptoms as listed below:   Review of Systems  Neurological:        Follow up, tremors increased within the last year.(Noted when son passing). Noticed when more anxious.  R > L. Did not pursue cpap  due to insurance (not pay for it).      Objective:  Neurological Exam  Physical Exam Physical Examination:   Vitals:   07/03/23 1035  BP: 134/78  Pulse: 67  SpO2: 97%    General Examination: The patient is a very pleasant 67 y.o. male in no acute distress. He appears well-developed and well-nourished and well groomed.   HEENT: Normocephalic, atraumatic, pupils are equal, round and reactive to light, extraocular tracking is well-preserved.  No obvious  nystagmus.  Hearing is grossly intact.  Face is symmetric with normal facial animation.  Speech is clear without dysarthria, hypophonia or voice tremor.  No nuchal rigidity noted, no carotid bruits.   Airway examination reveals moderate mouth dryness, moderate airway crowding secondary to Mallampati class III, small airway entry, lumbar spine.   Chest: Clear to auscultation without wheezing, rhonchi or crackles noted.   Heart: S1+S2+0, regular and normal without murmurs, rubs or gallops noted.    Abdomen: Soft, non-tender and non-distended.   Extremities: There is trace swelling in the distal lower extremities bilaterally, mainly around the ankles..     Skin: Warm and dry without trophic changes noted.    Musculoskeletal: exam reveals no obvious joint deformities.    Neurologically:  Mental status: The patient is awake, alert and oriented in all 4 spheres. His immediate and remote memory, attention, language skills and fund of knowledge are appropriate. There is no evidence of aphasia, agnosia, apraxia or anomia. Speech is clear with normal prosody and enunciation. Thought process is linear. Mood is normal and affect is normal.  Cranial nerves II - XII are as described above under HEENT exam.  Motor exam: Normal bulk, strength and tone is noted.  No drift or rebound.  There is no obvious action tremor today.  He has a very intermittent and slight resting tremor in the right thumb area only.  No lower extremity tremor, minimal bilateral postural tremor in the upper extremities.     (On 06/06/2022: on Archimedes spiral drawing he has insecurity with the left hand, no trembling of the right hand or left hand, handwriting is legible, not tremulous, not micrographic.)  On 07/03/2023: On Archimedes spiral drawing he has no significant trembling with the right hand, slight trembling with the left hand, handwriting is legible,  not particularly tremulous, not micrographic.  Fine motor skills and  coordination: Globally very mildly impaired with finger taps, hand movements and rapid alternating patting in the upper extremities, foot taps mildly impaired bilaterally.  No lateralization.  Cerebellar testing: No dysmetria or intention tremor. There is no truncal or gait ataxia.  Normal finger-to-nose.  Difficulty with heel-to-shin bilaterally. Sensory exam: intact to light touch in the upper and lower extremities.  Gait, station and balance: He stands with difficulty and pushes himself up.  He does not require any assistance, posture is mildly stooped for age.  He walks slowly and cautiously, no walking aid.  Fairly preserved arm swing bilaterally.  No shuffling.   Assessment and Plan:  In summary, LIONEL WOODBERRY is a 67 year old gentleman with an underlying medical history of coronary artery disease, with status post 5 vessel bypass, hypertension, sleep apnea, diabetes, and morbid obesity with a BMI of over 40, who presents for follow-up consultation of his hand tremors.  He may have a mild form of essential tremor.  His tremor is worse with stress and anxiety.  Unfortunately, he has had increase in stress lately.  He is currently in therapy for grief counseling per wife.  He is working on better diabetes control and weight loss.  He is working on better hydration with water.  He would like to pursue testing as discussed last year in the form of home sleep test to look into sleep apnea concern and he would be agreeable to pursuing brain MRI at this time.  We will do a chemistry panel today in preparation for the brain MRI with and without contrast.  We mutually agreed not to start any new medications at this time.  We will plan a follow-up after testing, we will keep him posted as to his test results by phone call in the interim.  We talked about the importance of maintaining a healthy lifestyle and tremor triggers again today.  I answered all the questions today and the patient and his wife were in  agreement.   I spent 40 minutes in total face-to-face time and in reviewing records during pre-charting, more than 50% of which was spent in counseling and coordination of care, reviewing test results, reviewing medications and treatment regimen and/or in discussing or reviewing the diagnosis of tremor of both hands, the prognosis and treatment options. Pertinent laboratory and imaging test results that were available during this visit with the patient were reviewed by me and considered in my medical decision making (see chart for details).   Addendum, 07/04/2023: I will change the brain MRI order to without contrast only due to creatinine level of 1.45, GFR 53.

## 2023-07-03 NOTE — Telephone Encounter (Signed)
sent to GI they obtain [employer] medicare auth [id number] 

## 2023-07-04 ENCOUNTER — Encounter: Payer: Self-pay | Admitting: Neurology

## 2023-07-04 ENCOUNTER — Telehealth: Payer: Self-pay | Admitting: *Deleted

## 2023-07-04 LAB — COMPREHENSIVE METABOLIC PANEL
ALT: 13 [IU]/L (ref 0–44)
AST: 16 [IU]/L (ref 0–40)
Albumin: 3.8 g/dL — ABNORMAL LOW (ref 3.9–4.9)
Alkaline Phosphatase: 79 [IU]/L (ref 44–121)
BUN/Creatinine Ratio: 17 (ref 10–24)
BUN: 25 mg/dL (ref 8–27)
Bilirubin Total: 0.3 mg/dL (ref 0.0–1.2)
CO2: 26 mmol/L (ref 20–29)
Calcium: 9.7 mg/dL (ref 8.6–10.2)
Chloride: 104 mmol/L (ref 96–106)
Creatinine, Ser: 1.45 mg/dL — ABNORMAL HIGH (ref 0.76–1.27)
Globulin, Total: 2.9 g/dL (ref 1.5–4.5)
Glucose: 186 mg/dL — ABNORMAL HIGH (ref 70–99)
Potassium: 4.5 mmol/L (ref 3.5–5.2)
Sodium: 147 mmol/L — ABNORMAL HIGH (ref 134–144)
Total Protein: 6.7 g/dL (ref 6.0–8.5)
eGFR: 53 mL/min/{1.73_m2} — ABNORMAL LOW (ref >59)

## 2023-07-04 NOTE — Telephone Encounter (Signed)
-----   Message from Huston Foley sent at 07/04/2023  8:27 AM EST ----- Please advise patient that I changed his brain MRI order to without contrast, as his kidney function is not good enough to allow for contrast administration. His sodium level was mildly elevated and his sugar level was elevated.  I recommend that he try to hydrate better with water as discussed.  I also recommend that he make a follow-up appointment with his primary care physician for follow-up labs in the next 2 to 3 months.

## 2023-07-04 NOTE — Telephone Encounter (Signed)
I spoke to pt and relayed the results of labs to pt.  He verbalized understanding.  MRI to be w/o contrast.  F/u up with pcp in 2-3 months for repeat labs. Sent lab results to pcp.

## 2023-07-04 NOTE — Addendum Note (Signed)
Addended by: Huston Foley on: 07/04/2023 08:19 AM   Modules accepted: Orders

## 2023-07-22 ENCOUNTER — Ambulatory Visit: Payer: Medicare HMO | Admitting: Podiatry

## 2023-07-29 ENCOUNTER — Ambulatory Visit
Admission: RE | Admit: 2023-07-29 | Discharge: 2023-07-29 | Disposition: A | Discharge status: Home / Self Care | Payer: Medicare HMO | Source: Ambulatory Visit | Attending: Neurology | Admitting: Neurology

## 2023-07-29 DIAGNOSIS — G4733 Obstructive sleep apnea (adult) (pediatric): Secondary | ICD-10-CM

## 2023-07-29 DIAGNOSIS — Z9189 Other specified personal risk factors, not elsewhere classified: Secondary | ICD-10-CM

## 2023-07-29 DIAGNOSIS — R251 Tremor, unspecified: Secondary | ICD-10-CM

## 2023-07-30 ENCOUNTER — Telehealth: Payer: Self-pay

## 2023-07-30 NOTE — Telephone Encounter (Signed)
-----   Message from Huston Foley sent at 07/30/2023  1:08 PM EST ----- Please call patient and advise him that his recent brain MRI without contrast did not show any acute findings.  There were mild age-related chronic findings, nothing to explain his tremor.

## 2023-07-30 NOTE — Telephone Encounter (Signed)
Contacted pt, informed him and wife that his recent brain MRI without contrast did not show any acute findings.  There were mild age-related chronic findings, nothing to explain his tremor.  Advised to call the office back with any questions or concerns as he had none at this time.  Patient verbally understood and was appreciative.

## 2023-08-06 ENCOUNTER — Ambulatory Visit (INDEPENDENT_AMBULATORY_CARE_PROVIDER_SITE_OTHER): Payer: Medicare HMO | Admitting: Neurology

## 2023-08-06 DIAGNOSIS — G4733 Obstructive sleep apnea (adult) (pediatric): Secondary | ICD-10-CM | POA: Diagnosis not present

## 2023-08-06 DIAGNOSIS — G4734 Idiopathic sleep related nonobstructive alveolar hypoventilation: Secondary | ICD-10-CM

## 2023-08-06 DIAGNOSIS — Z9189 Other specified personal risk factors, not elsewhere classified: Secondary | ICD-10-CM

## 2023-08-06 DIAGNOSIS — R251 Tremor, unspecified: Secondary | ICD-10-CM

## 2023-08-07 NOTE — Addendum Note (Signed)
Addended by: Huston Foley on: 08/07/2023 06:07 PM   Modules accepted: Orders

## 2023-08-07 NOTE — Procedures (Signed)
[facility]  HOME SLEEP TEST (Watch PAT) REPORT  STUDY DATE: 08/06/2023  DOB: [birth year]  MRN: [id number]  ORDERING CLINICIAN: Huston Foley, MD, PhD   REFERRING CLINICIAN: Fleet Contras, MD   CLINICAL INFORMATION/HISTORY: 67 year old gentleman with an underlying medical history of coronary artery disease, with status post 5 vessel bypass, hypertension, sleep apnea, diabetes, and morbid obesity with a BMI of over 40, who has a prior diagnosis of obstructive sleep apnea but is currently not on PAP therapy.  Epworth sleepiness score: 10/24.  BMI: 42.9 kg/m  FINDINGS:   Sleep Summary:   Total Recording Time (hours, min): 9 hours, 44 min  Total Sleep Time (hours, min):  7 hours, 49 min  Percent REM (%):    26.1%   Respiratory Indices:   Calculated pAHI (per hour):  48.9/hour         REM pAHI:    64.9/hour       NREM pAHI: 41.1/hour  Central pAHI: 4.0/hour  Oxygen Saturation Statistics:    Oxygen Saturation (%) Mean: 90%   Minimum oxygen saturation (%):                 84%   O2 Saturation Range (%): 84-98%    O2 Saturation (minutes) <=88%: 30.8 min  Pulse Rate Statistics:   Pulse Mean (bpm):    63/min    Pulse Range (46-97/min)   IMPRESSION: OSA (obstructive sleep apnea), severe Nocturnal hypoxemia  RECOMMENDATION:  This home sleep test demonstrates severe obstructive sleep apnea with a total AHI of 48.9/hour and O2 nadir of 84% with significant time below or at 88% saturation of over 30 minutes for the study, indicating nocturnal hypoxemia. Snoring was detected, in the mild to moderate range, at times louder.  Treatment with positive airway pressure is highly recommended. The patient will be advised to proceed with an autoPAP titration/trial at home. A laboratory attended titration study can be considered in the future for optimization of treatment settings and to improve tolerance and compliance, if needed, down the road. Alternative  treatment options are limited secondary to the severity of the patient's sleep disordered breathing, but may include surgical treatment with an implantable hypoglossal nerve stimulator (in carefully selected candidates, meeting criteria).  Concomitant weight loss is recommended (where clinically appropriate). Please note, that untreated obstructive sleep apnea may carry additional perioperative morbidity. Patients with significant obstructive sleep apnea should receive perioperative PAP therapy and the surgeons and particularly the anesthesiologist should be informed of the diagnosis and the severity of the sleep disordered breathing. The patient should be cautioned not to drive, work at heights, or operate dangerous or heavy equipment when tired or sleepy. Review and reiteration of good sleep hygiene measures should be pursued with any patient. Other causes of the patient's symptoms, including circadian rhythm disturbances, an underlying mood disorder, medication effect and/or an underlying medical problem cannot be ruled out based on this test. Clinical correlation is recommended.  The patient and his referring provider will be notified of the test results. The patient will be seen in follow up in sleep clinic at [facility].  I certify that I have reviewed the raw data recording prior to the issuance of this report in accordance with the standards of the American Academy of Sleep Medicine (AASM).  INTERPRETING PHYSICIAN:   Huston Foley, MD, PhD Medical Director, [facility]) Diplomat, ABPN (Neurology and Sleep)   [facility] [address], [address] [place], [place] [address] [phone]  [phone]                

## 2023-08-07 NOTE — Progress Notes (Signed)
See procedure note.

## 2023-08-09 ENCOUNTER — Encounter: Payer: Self-pay | Admitting: Podiatry

## 2023-08-09 ENCOUNTER — Ambulatory Visit (INDEPENDENT_AMBULATORY_CARE_PROVIDER_SITE_OTHER): Payer: Medicare HMO | Admitting: Podiatry

## 2023-08-09 DIAGNOSIS — B351 Tinea unguium: Secondary | ICD-10-CM

## 2023-08-09 DIAGNOSIS — M79674 Pain in right toe(s): Secondary | ICD-10-CM | POA: Diagnosis not present

## 2023-08-09 DIAGNOSIS — M79675 Pain in left toe(s): Secondary | ICD-10-CM | POA: Diagnosis not present

## 2023-08-09 NOTE — Progress Notes (Signed)
This patient returns to my office for at risk foot care.  This patient requires this care by a professional since this patient will be at risk due to taking metformin.  This patient is unable to cut nails himself since the patient cannot reach his nails.These nails are painful walking and wearing shoes.  He presents to the office with his wife.This patient presents for at risk foot care today.  General Appearance  Alert, conversant and in no acute stress.  Vascular  Dorsalis pedis and posterior tibial  pulses are  weakly palpable  bilaterally.  Capillary return is within normal limits  bilaterally. Temperature is within normal limits  bilaterally.  Neurologic  Senn-Weinstein monofilament wire test within normal limits  bilaterally. Muscle power within normal limits bilaterally.  Nails Thick disfigured discolored nails with subungual debris  from hallux to fifth toes bilaterally. No evidence of bacterial infection or drainage bilaterally.  Orthopedic  No limitations of motion  feet .  No crepitus or effusions noted.  No bony pathology or digital deformities noted.  Skin  normotropic skin with no porokeratosis noted bilaterally.  No signs of infections or ulcers noted.     Onychomycosis  Pain in right toes  Pain in left toes  Consent was obtained for treatment procedures.   Mechanical debridement of nails 1-5  bilaterally performed with a nail nipper.  Filed with dremel without incident.    Return office visit    3 months                  Told patient to return for periodic foot care and evaluation due to potential at risk complications.   Helane Gunther DPM

## 2023-08-12 ENCOUNTER — Telehealth: Payer: Self-pay | Admitting: *Deleted

## 2023-08-12 NOTE — Telephone Encounter (Signed)
-----   Message from Huston Foley sent at 08/07/2023  6:07 PM EST ----- Patient referred by PCP for hand tremors but we also talked about his prior diagnosis of OSA, last seen by me on 07/03/2023, patient had a HST on 08/06/2023.    Please call and notify the patient that the recent home sleep test showed obstructive sleep apnea in the severe range. I recommend treatment for this in the form of autoPAP, which means, that we don't have to bring him in for a sleep study with CPAP, but will let him start using a so called autoPAP machine at home, through a DME company (of his choice, or as per insurance requirement). The DME representative will fit the patient with a mask of choice, educate him on how to use the machine, how to put the mask on, etc. I have placed an order in the chart. Please send the order to a local DME, talk to patient, send report to referring MD. Please also reinforce the need for compliance with treatment. We will need a FU in sleep clinic for 10 weeks post-PAP set up, please arrange that with me or one of our NPs. Thanks,   Huston Foley, MD, PhD [facility])

## 2023-08-12 NOTE — Telephone Encounter (Signed)
 Zott, Linnell Fulling, Otilio Jefferson, RN; [place], [place] Got It Thank  You

## 2023-08-12 NOTE — Telephone Encounter (Signed)
I spoke with the patient and discussed his sleep study results as noted below by Dr Frances Furbish. He is amenable to proceeding with AutoPap set up as quick as possible for severe sleep apnea. We discussed the insurance compliance requirements which includes using the machine at least 4 hours at night and also being seen by our office between 30 and 90 days after set up. We scheduled pt for initial follow-up appt on 11/11/23 at 1130 am arrival 11 (preferred Monday or Friday).  I asked him to let us know if he does not hear from them [facility] within 1 week so we can follow-up. He thanked me for the call.   Urgent referral sent to [facility]. Sleep study results sent to referring provider.

## 2023-09-16 NOTE — Telephone Encounter (Signed)
Zott, Linnell Fulling, Otilio Jefferson, RN; Zott, Kennyth Arnold; [place], [place] Just providing an update on patient, he had asked [place] to hold his order and process it after January 1 due to a change in his insurance to [facility].  His order is currently still processing thru [facility] and we are reaching out to them for an update.

## 2023-10-03 ENCOUNTER — Telehealth: Payer: Self-pay | Admitting: Adult Health

## 2023-10-03 NOTE — Telephone Encounter (Signed)
LVM and sent mychart msg informing pt of appt change due to NP being out 

## 2023-11-08 ENCOUNTER — Ambulatory Visit (INDEPENDENT_AMBULATORY_CARE_PROVIDER_SITE_OTHER): Payer: Medicare HMO | Admitting: Podiatry

## 2023-11-08 ENCOUNTER — Encounter: Payer: Self-pay | Admitting: Podiatry

## 2023-11-08 DIAGNOSIS — M79674 Pain in right toe(s): Secondary | ICD-10-CM

## 2023-11-08 DIAGNOSIS — M79675 Pain in left toe(s): Secondary | ICD-10-CM

## 2023-11-08 DIAGNOSIS — B351 Tinea unguium: Secondary | ICD-10-CM | POA: Diagnosis not present

## 2023-11-08 NOTE — Progress Notes (Signed)
This patient returns to my office for at risk foot care.  This patient requires this care by a professional since this patient will be at risk due to taking metformin.  This patient is unable to cut nails himself since the patient cannot reach his nails.These nails are painful walking and wearing shoes.  He presents to the office with his wife.This patient presents for at risk foot care today.  General Appearance  Alert, conversant and in no acute stress.  Vascular  Dorsalis pedis and posterior tibial  pulses are  weakly palpable  bilaterally.  Capillary return is within normal limits  bilaterally. Temperature is within normal limits  bilaterally.  Neurologic  Senn-Weinstein monofilament wire test within normal limits  bilaterally. Muscle power within normal limits bilaterally.  Nails Thick disfigured discolored nails with subungual debris  from hallux to fifth toes bilaterally. No evidence of bacterial infection or drainage bilaterally.  Orthopedic  No limitations of motion  feet .  No crepitus or effusions noted.  No bony pathology or digital deformities noted.  Skin  normotropic skin with no porokeratosis noted bilaterally.  No signs of infections or ulcers noted.     Onychomycosis  Pain in right toes  Pain in left toes  Consent was obtained for treatment procedures.   Mechanical debridement of nails 1-5  bilaterally performed with a nail nipper.  Filed with dremel without incident.    Return office visit    3 months                  Told patient to return for periodic foot care and evaluation due to potential at risk complications.   Helane Gunther DPM

## 2023-11-08 NOTE — Progress Notes (Deleted)
 PATIENT: BURNICE OESTREICHER DOB: 12/23/1955  REASON FOR VISIT: follow up HISTORY FROM: patient PRIMARY NEUROLOGIST:   HISTORY OF PRESENT ILLNESS: Today 11/08/23  HISTORY   REVIEW OF SYSTEMS: Out of a complete 14 system review of symptoms, the patient complains only of the following symptoms, and all other reviewed systems are negative.  FSS ESS  ALLERGIES: Allergies  Allergen Reactions   Lisinopril Swelling    HOME MEDICATIONS: Outpatient Medications Prior to Visit  Medication Sig Dispense Refill   acetaminophen (TYLENOL) 500 MG tablet Take 1 tablet (500 mg total) by mouth every 6 (six) hours as needed for mild pain or moderate pain. 30 tablet 0   amLODipine (NORVASC) 5 MG tablet Take 5 mg by mouth daily.     aspirin 325 MG tablet Take 325 mg by mouth daily.     atorvastatin (LIPITOR) 40 MG tablet Take 40 mg by mouth.      diphenhydrAMINE (BENADRYL ALLERGY) 25 MG tablet Take 1 tablet (25 mg total) by mouth every 6 (six) hours as needed for itching or allergies. 30 tablet 0   EPINEPHrine 0.3 mg/0.3 mL IJ SOAJ injection Inject 0.3 mg into the muscle as needed for anaphylaxis. 1 each 2   escitalopram (LEXAPRO) 20 MG tablet Take 20 mg by mouth daily.     furosemide (LASIX) 40 MG tablet Take 40 mg by mouth daily.  3   hydrochlorothiazide (MICROZIDE) 12.5 MG capsule Take 1 capsule (12.5 mg total) by mouth daily. 30 capsule 2   metFORMIN (GLUCOPHAGE) 1000 MG tablet Take 1,000 mg by mouth 2 (two) times daily.     metoprolol tartrate (LOPRESSOR) 25 MG tablet Take 25 mg by mouth 2 (two) times daily.     pantoprazole (PROTONIX) 40 MG tablet Take 40 mg by mouth daily.      Vitamin D, Ergocalciferol, (DRISDOL) 1.25 MG (50000 UNIT) CAPS capsule Take 50,000 Units by mouth once a week.     No facility-administered medications prior to visit.    PAST MEDICAL HISTORY: Past Medical History:  Diagnosis Date   CAD (coronary artery disease)    Diabetes mellitus without complication (HCC)     Hypertension    Obesity    Sleep apnea     PAST SURGICAL HISTORY: Past Surgical History:  Procedure Laterality Date   CORONARY ARTERY BYPASS GRAFT  2010    FAMILY HISTORY: Family History  Problem Relation Age of Onset   Tremor Mother    CAD Brother    Tremor Brother     SOCIAL HISTORY: Social History   Socioeconomic History   Marital status: Married    Spouse name: Not on file   Number of children: Not on file   Years of education: Not on file   Highest education level: Not on file  Occupational History   Not on file  Tobacco Use   Smoking status: Every Day    Current packs/day: 1.00    Types: Cigarettes   Smokeless tobacco: Never  Substance and Sexual Activity   Alcohol use: Yes    Comment: occ   Drug use: No   Sexual activity: Not on file  Other Topics Concern   Not on file  Social History Narrative   Not on file   Social Drivers of Health   Financial Resource Strain: Not on file  Food Insecurity: Not on file  Transportation Needs: Not on file  Physical Activity: Not on file  Stress: Not on file  Social Connections: Not  on file  Intimate Partner Violence: Not on file      PHYSICAL EXAM  There were no vitals filed for this visit. There is no height or weight on file to calculate BMI.  Generalized: Well developed, in no acute distress  Chest: Lungs clear to auscultation bilaterally  Neurological examination  Mentation: Alert oriented to time, place, history taking. Follows all commands speech and language fluent Cranial nerve II-XII: Extraocular movements were full, visual field were full on confrontational test Head turning and shoulder shrug  were normal and symmetric. Motor: The motor testing reveals 5 over 5 strength of all 4 extremities. Good symmetric motor tone is noted throughout.  Sensory: Sensory testing is intact to soft touch on all 4 extremities. No evidence of extinction is noted.  Gait and station: Gait is normal.    DIAGNOSTIC  DATA (LABS, IMAGING, TESTING) - I reviewed patient records, labs, notes, testing and imaging myself where available.  Lab Results  Component Value Date   WBC 6.4 04/24/2023   HGB 12.4 (L) 04/24/2023   HCT 39.0 04/24/2023   MCV 79.6 (L) 04/24/2023   PLT 380 04/24/2023      Component Value Date/Time   NA 147 (H) 07/03/2023 1132   K 4.5 07/03/2023 1132   CL 104 07/03/2023 1132   CO2 26 07/03/2023 1132   GLUCOSE 186 (H) 07/03/2023 1132   GLUCOSE 134 (H) 04/24/2023 0000   BUN 25 07/03/2023 1132   CREATININE 1.45 (H) 07/03/2023 1132   CREATININE 1.45 (H) 04/24/2023 0000   CALCIUM 9.7 07/03/2023 1132   PROT 6.7 07/03/2023 1132   ALBUMIN 3.8 (L) 07/03/2023 1132   AST 16 07/03/2023 1132   ALT 13 07/03/2023 1132   ALKPHOS 79 07/03/2023 1132   BILITOT 0.3 07/03/2023 1132   GFRNONAA >60 12/20/2021 0035   GFRNONAA 82 02/13/2021 1646   GFRAA 95 02/13/2021 1646   Lab Results  Component Value Date   CHOL 210 (H) 04/24/2023   HDL 58 04/24/2023   LDLCALC 137 (H) 04/24/2023   TRIG 63 04/24/2023   CHOLHDL 3.6 04/24/2023   Lab Results  Component Value Date   HGBA1C 7.7 (H) 12/20/2021   Lab Results  Component Value Date   VITAMINB12 398 05/18/2022   Lab Results  Component Value Date   TSH 1.00 04/24/2023      ASSESSMENT AND PLAN 68 y.o. year old male  has a past medical history of CAD (coronary artery disease), Diabetes mellitus without complication (HCC), Hypertension, Obesity, and Sleep apnea. here with:  OSA on CPAP  - CPAP compliance excellent - Good treatment of AHI  - Encourage patient to use CPAP nightly and > 4 hours each night - F/U in 1 year or sooner if needed   I spent *** minutes of face-to-face and non-face-to-face time with patient.  This included previsit chart review, lab review, study review, order entry, electronic health record documentation, patient education.  Butch Penny, MSN, NP-C 11/08/2023, 10:54 AM [facility] [address] [place], [place] [address] [phone]

## 2023-11-11 ENCOUNTER — Telehealth: Payer: Self-pay | Admitting: *Deleted

## 2023-11-11 ENCOUNTER — Encounter: Payer: Medicare HMO | Admitting: Adult Health

## 2023-11-11 ENCOUNTER — Ambulatory Visit: Payer: Medicare HMO | Admitting: Adult Health

## 2023-11-11 NOTE — Telephone Encounter (Signed)
 Pt had arrived to office for initial autopap f/u with [facility] NP. Unfortunately this visit would be too soon as his setup date was 10/25/23. I spoke with patient and wife. They are completely fine with r/s to a later date where we can also combine tremor and autopap f/u. I r/s him to 4/28 at 9:00 am (needed a Monday or Friday). Pt's wife said they were going to mention in visit today that patient fell when they were in [place]. Pt was seen at the hospital and had a full workup including head CT. It had been raining. He slipped on the hard floor while bending down to reach a room key he had dropped. His feet flew out of from under him. He fell forward and his head hit the wall and then he fell onto the floor. He had a knot on the top of his head which they didn't see until the week after. He has not had any other falls or any complaints of balance problems since then. His knee is bothering him and he has arthritis. He had a steroid injection this week. I updated his medication list since they were here. They were very appreciative and will call us sooner should they need anything before his next visit.

## 2023-11-14 NOTE — Progress Notes (Signed)
 This encounter was created in error - please disregard.

## 2023-12-19 NOTE — Progress Notes (Signed)
 Aaron Aas

## 2023-12-23 ENCOUNTER — Ambulatory Visit: Admitting: Adult Health

## 2023-12-23 ENCOUNTER — Encounter: Payer: Self-pay | Admitting: Adult Health

## 2023-12-23 VITALS — BP 145/86 | HR 68 | Ht 75.0 in | Wt 312.4 lb

## 2023-12-23 DIAGNOSIS — R251 Tremor, unspecified: Secondary | ICD-10-CM

## 2023-12-23 DIAGNOSIS — G4733 Obstructive sleep apnea (adult) (pediatric): Secondary | ICD-10-CM

## 2023-12-23 NOTE — Progress Notes (Addendum)
 Order sent to [facility].  Changed pressure to 7-15cm in airview.  Zott, Nicky Barrack, RN; [place], [place] Got it Thank You       Previous Messages    ----- Message ----- From: Viktoria Gray, RN Sent: 12/23/2023   3:27 PM EDT To: Russel Courser Zott Subject: new order in epic                              -New order in epic.  Increase pressure  7-15 cm  [facility]  Elihu Grumet Male, 68 y.o., 1956/06/07 MRN: [id number]

## 2023-12-23 NOTE — Progress Notes (Signed)
 PATIENT: Oscar Day DOB: Mar 29, 1956  REASON FOR VISIT: follow up HISTORY FROM: patient PRIMARY NEUROLOGIST: Dr. Omar Bibber   Chief Complaint  Patient presents with   Follow-up    Rm 19, alone.       HISTORY OF PRESENT ILLNESS: Today 12/23/23:  Oscar Day is a 68 y.o. male with a history of obstructive sleep apnea on CPAP and tremor. Returns today for follow-up.  He reports that he CPAP is working well.  He is still trying to get adjusted to using the machine nightly.  He states that he does like his mask.  He has all to adjust the straps to keep it from leaking.  He feels that his tremor is better.  Tremor affects both hands equally according to patient.  Download for CPAP is below       HISTORY     REVIEW OF SYSTEMS: Out of a complete 14 system review of symptoms, the patient complains only of the following symptoms, and all other reviewed systems are negative.  FSS ESS  ALLERGIES: Allergies  Allergen Reactions   Lisinopril Swelling    HOME MEDICATIONS: Outpatient Medications Prior to Visit  Medication Sig Dispense Refill   acetaminophen  (TYLENOL ) 500 MG tablet Take 1 tablet (500 mg total) by mouth every 6 (six) hours as needed for mild pain or moderate pain. 30 tablet 0   amLODipine (NORVASC) 5 MG tablet Take 5 mg by mouth daily.     aspirin  325 MG tablet Take 325 mg by mouth daily.     atorvastatin  (LIPITOR) 40 MG tablet Take 40 mg by mouth.      diphenhydrAMINE  (BENADRYL  ALLERGY) 25 MG tablet Take 1 tablet (25 mg total) by mouth every 6 (six) hours as needed for itching or allergies. 30 tablet 0   EPINEPHrine  0.3 mg/0.3 mL IJ SOAJ injection Inject 0.3 mg into the muscle as needed for anaphylaxis. 1 each 2   escitalopram (LEXAPRO) 20 MG tablet Take 20 mg by mouth daily.     furosemide (LASIX) 40 MG tablet Take 40 mg by mouth daily.  3   HYDROcodone-acetaminophen  (NORCO) 7.5-325 MG tablet Take 1 tablet by mouth every 6 (six) hours as needed.     JARDIANCE 25  MG TABS tablet Take 25 mg by mouth daily.     metFORMIN (GLUCOPHAGE) 1000 MG tablet Take 1,000 mg by mouth 2 (two) times daily.     metoprolol tartrate (LOPRESSOR) 25 MG tablet Take 25 mg by mouth 2 (two) times daily.     OZEMPIC, 1 MG/DOSE, 4 MG/3ML SOPN Inject 2 mg into the skin once a week.     pantoprazole  (PROTONIX ) 40 MG tablet Take 40 mg by mouth daily.      Vitamin D , Ergocalciferol , (DRISDOL) 1.25 MG (50000 UNIT) CAPS capsule Take 50,000 Units by mouth once a week.     hydrochlorothiazide  (MICROZIDE ) 12.5 MG capsule Take 1 capsule (12.5 mg total) by mouth daily. 30 capsule 2   No facility-administered medications prior to visit.    PAST MEDICAL HISTORY: Past Medical History:  Diagnosis Date   CAD (coronary artery disease)    Diabetes mellitus without complication (HCC)    Hypertension    Obesity    Sleep apnea     PAST SURGICAL HISTORY: Past Surgical History:  Procedure Laterality Date   CORONARY ARTERY BYPASS GRAFT  2010    FAMILY HISTORY: Family History  Problem Relation Age of Onset   Tremor Mother  CAD Brother    Tremor Brother     SOCIAL HISTORY: Social History   Socioeconomic History   Marital status: Married    Spouse name: Not on file   Number of children: Not on file   Years of education: Not on file   Highest education level: Not on file  Occupational History   Not on file  Tobacco Use   Smoking status: Every Day    Current packs/day: 1.00    Types: Cigarettes   Smokeless tobacco: Never  Vaping Use   Vaping status: Never Used  Substance and Sexual Activity   Alcohol use: Yes    Comment: occ   Drug use: No   Sexual activity: Not on file  Other Topics Concern   Not on file  Social History Narrative   Not on file   Social Drivers of Health   Financial Resource Strain: Not on file  Food Insecurity: Not on file  Transportation Needs: Not on file  Physical Activity: Not on file  Stress: Not on file  Social Connections: Not on file   Intimate Partner Violence: Not on file      PHYSICAL EXAM  Vitals:   12/23/23 0858  BP: (!) 145/86  Pulse: 68  Weight: (!) 312 lb 6.4 oz (141.7 kg)  Height: 6\' 3"  (1.905 m)   Body mass index is 39.05 kg/m.  Generalized: Well developed, in no acute distress  Chest: Lungs clear to auscultation bilaterally  Neurological examination  Mentation: Alert oriented to time, place, history taking. Follows all commands speech and language fluent Cranial nerve II-XII: Extraocular movements were full, visual field were full on confrontational test Head turning and shoulder shrug  were normal and symmetric. Motor: The motor testing reveals 5 over 5 strength of all 4 extremities. Good symmetric motor tone is noted throughout.  Sensory: Sensory testing is intact to soft touch on all 4 extremities. No evidence of extinction is noted.  Gait and station: Gait is normal.    DIAGNOSTIC DATA (LABS, IMAGING, TESTING) - I reviewed patient records, labs, notes, testing and imaging myself where available.  Lab Results  Component Value Date   WBC 6.4 04/24/2023   HGB 12.4 (L) 04/24/2023   HCT 39.0 04/24/2023   MCV 79.6 (L) 04/24/2023   PLT 380 04/24/2023      Component Value Date/Time   NA 147 (H) 07/03/2023 1132   K 4.5 07/03/2023 1132   CL 104 07/03/2023 1132   CO2 26 07/03/2023 1132   GLUCOSE 186 (H) 07/03/2023 1132   GLUCOSE 134 (H) 04/24/2023 0000   BUN 25 07/03/2023 1132   CREATININE 1.45 (H) 07/03/2023 1132   CREATININE 1.45 (H) 04/24/2023 0000   CALCIUM  9.7 07/03/2023 1132   PROT 6.7 07/03/2023 1132   ALBUMIN 3.8 (L) 07/03/2023 1132   AST 16 07/03/2023 1132   ALT 13 07/03/2023 1132   ALKPHOS 79 07/03/2023 1132   BILITOT 0.3 07/03/2023 1132   GFRNONAA >60 12/20/2021 0035   GFRNONAA 82 02/13/2021 1646   GFRAA 95 02/13/2021 1646   Lab Results  Component Value Date   CHOL 210 (H) 04/24/2023   HDL 58 04/24/2023   LDLCALC 137 (H) 04/24/2023   TRIG 63 04/24/2023   CHOLHDL  3.6 04/24/2023   Lab Results  Component Value Date   HGBA1C 7.7 (H) 12/20/2021   Lab Results  Component Value Date   VITAMINB12 398 05/18/2022   Lab Results  Component Value Date   TSH 1.00 04/24/2023  ASSESSMENT AND PLAN 68 y.o. year old male  has a past medical history of CAD (coronary artery disease), Diabetes mellitus without complication (HCC), Hypertension, Obesity, and Sleep apnea. here with:  OSA on CPAP  - CPAP compliance suboptimal - Good treatment of AHI  - Encourage patient to use CPAP nightly and > 4 hours each night -Will adjust pressure 7-15 cmH2O -In the future we can do a mask refitting if needed  2.  Tremor  - Improved, will continue to monitor    - F/U in 3 months we will get a download  Clem Currier, MSN, NP-C 12/23/2023, 9:12 AM [facility] [address], [address] [place], [place] [address] [phone]

## 2023-12-23 NOTE — Patient Instructions (Signed)
 Continue using CPAP nightly and greater than 4 hours each night Change pressure 7-15- DME will do this If your symptoms worsen or you develop new symptoms please let us  know.

## 2024-02-10 ENCOUNTER — Ambulatory Visit: Admitting: Podiatry

## 2024-03-04 ENCOUNTER — Ambulatory Visit: Admitting: Podiatry

## 2024-03-05 ENCOUNTER — Encounter: Payer: Self-pay | Admitting: *Deleted

## 2024-03-05 ENCOUNTER — Telehealth: Payer: Self-pay | Admitting: Adult Health

## 2024-03-05 NOTE — Telephone Encounter (Signed)
Request to reschedule appointment

## 2024-03-06 ENCOUNTER — Telehealth: Admitting: Adult Health

## 2024-03-16 ENCOUNTER — Ambulatory Visit: Admitting: Podiatry

## 2024-03-20 ENCOUNTER — Ambulatory Visit: Admitting: Podiatry

## 2024-03-20 ENCOUNTER — Encounter: Payer: Self-pay | Admitting: Podiatry

## 2024-03-20 DIAGNOSIS — M79674 Pain in right toe(s): Secondary | ICD-10-CM

## 2024-03-20 DIAGNOSIS — M79675 Pain in left toe(s): Secondary | ICD-10-CM | POA: Diagnosis not present

## 2024-03-20 DIAGNOSIS — B351 Tinea unguium: Secondary | ICD-10-CM

## 2024-03-20 NOTE — Progress Notes (Signed)
This patient returns to my office for at risk foot care.  This patient requires this care by a professional since this patient will be at risk due to taking metformin.  This patient is unable to cut nails himself since the patient cannot reach his nails.These nails are painful walking and wearing shoes.  He presents to the office with his wife.This patient presents for at risk foot care today.  General Appearance  Alert, conversant and in no acute stress.  Vascular  Dorsalis pedis and posterior tibial  pulses are  weakly palpable  bilaterally.  Capillary return is within normal limits  bilaterally. Temperature is within normal limits  bilaterally.  Neurologic  Senn-Weinstein monofilament wire test within normal limits  bilaterally. Muscle power within normal limits bilaterally.  Nails Thick disfigured discolored nails with subungual debris  from hallux to fifth toes bilaterally. No evidence of bacterial infection or drainage bilaterally.  Orthopedic  No limitations of motion  feet .  No crepitus or effusions noted.  No bony pathology or digital deformities noted.  Skin  normotropic skin with no porokeratosis noted bilaterally.  No signs of infections or ulcers noted.     Onychomycosis  Pain in right toes  Pain in left toes  Consent was obtained for treatment procedures.   Mechanical debridement of nails 1-5  bilaterally performed with a nail nipper.  Filed with dremel without incident.    Return office visit    3 months                  Told patient to return for periodic foot care and evaluation due to potential at risk complications.   Helane Gunther DPM

## 2024-05-26 ENCOUNTER — Encounter: Payer: Self-pay | Admitting: *Deleted

## 2024-05-27 ENCOUNTER — Telehealth: Admitting: Adult Health

## 2024-06-22 ENCOUNTER — Encounter: Payer: Self-pay | Admitting: Podiatry

## 2024-06-22 ENCOUNTER — Ambulatory Visit: Admitting: Podiatry

## 2024-06-22 DIAGNOSIS — M79674 Pain in right toe(s): Secondary | ICD-10-CM | POA: Diagnosis not present

## 2024-06-22 DIAGNOSIS — B351 Tinea unguium: Secondary | ICD-10-CM | POA: Diagnosis not present

## 2024-06-22 DIAGNOSIS — E119 Type 2 diabetes mellitus without complications: Secondary | ICD-10-CM | POA: Diagnosis not present

## 2024-06-22 DIAGNOSIS — M79675 Pain in left toe(s): Secondary | ICD-10-CM | POA: Diagnosis not present

## 2024-06-22 NOTE — Progress Notes (Signed)
 This patient returns to my office for at risk foot care.  This patient requires this care by a professional since this patient will be at risk due to taking metformin.  This patient is unable to cut nails himself since the patient cannot reach his nails.These nails are painful walking and wearing shoes.  He presents to the office with his wife.This patient presents for at risk foot care today.  General Appearance  Alert, conversant and in no acute stress.  Vascular  Dorsalis pedis and posterior tibial  pulses are  weakly palpable  bilaterally.  Capillary return is within normal limits  bilaterally. Temperature is within normal limits  bilaterally.  Neurologic  Senn-Weinstein monofilament wire test within normal limits  bilaterally. Muscle power within normal limits bilaterally.  Nails Thick disfigured discolored nails with subungual debris  from hallux to fifth toes bilaterally. No evidence of bacterial infection or drainage bilaterally.  Orthopedic  No limitations of motion  feet .  No crepitus or effusions noted.  No bony pathology or digital deformities noted.  Skin  normotropic skin with no porokeratosis noted bilaterally.  No signs of infections or ulcers noted.     Onychomycosis  Pain in right toes  Pain in left toes  Consent was obtained for treatment procedures.   Mechanical debridement of nails 1-5  bilaterally performed with a nail nipper.  Filed with dremel without incident.    Return office visit    3 months                  Told patient to return for periodic foot care and evaluation due to potential at risk complications.   Cordella Bold DPM  tomma

## 2024-06-30 ENCOUNTER — Ambulatory Visit (INDEPENDENT_AMBULATORY_CARE_PROVIDER_SITE_OTHER): Admitting: Neurology

## 2024-06-30 ENCOUNTER — Encounter: Payer: Self-pay | Admitting: Neurology

## 2024-06-30 VITALS — BP 124/72 | HR 72 | Ht 75.0 in | Wt 286.0 lb

## 2024-06-30 DIAGNOSIS — R634 Abnormal weight loss: Secondary | ICD-10-CM

## 2024-06-30 DIAGNOSIS — G4733 Obstructive sleep apnea (adult) (pediatric): Secondary | ICD-10-CM | POA: Diagnosis not present

## 2024-06-30 DIAGNOSIS — Z789 Other specified health status: Secondary | ICD-10-CM | POA: Diagnosis not present

## 2024-06-30 NOTE — Patient Instructions (Signed)
 Good job with the weight loss! As discussed, we will do another home sleep test to re-evaluate your sleep apnea.  In the meantime: Please continue using your autoPAP regularly. While your insurance requires that you use PAP at least 4 hours each night on 70% of the nights, I recommend, that you not skip any nights and use it throughout the night if you can. Getting used to PAP and staying with the treatment long term does take time and patience and discipline. Untreated obstructive sleep apnea when it is moderate to severe can have an adverse impact on cardiovascular health and raise her risk for heart disease, arrhythmias, hypertension, congestive heart failure, stroke and diabetes. Untreated obstructive sleep apnea causes sleep disruption, nonrestorative sleep, and sleep deprivation. This can have an impact on your day to day functioning and cause daytime sleepiness and impairment of cognitive function, memory loss, mood disturbance, and problems focussing. Using PAP regularly can improve these symptoms.

## 2024-06-30 NOTE — Progress Notes (Signed)
 Subjective:    Patient ID: Oscar Day is a 68 y.o. male.  HPI    Interim history:   Oscar Day is a 68 year old male with an underlying medical history of coronary artery disease, with status post 5 vessel bypass, tremor, hypertension, sleep apnea, diabetes, and obesity, who presents for follow-up consultation of his obstructive sleep apnea, on AutoPap therapy.  The patient is accompanied by his wife today.  He was last seen in our clinic by Duwaine Eriksson, NP in April 2025 at which time he was not fully compliant with his AutoPap.  Today, 06/30/2024: I reviewed his AutoPap compliance data for the past 90 days, he used his machine 59 days with percent use days greater than 4 hours at 39% only, indicating suboptimal compliance, average usage for days on treatment of 4 hours and 36 minutes, residual AHI elevated at 18.9 cm with a pressure range of 7 to 15 cm with EPR of 3, 95th percentile at 13 cm.  Leak on the higher side with the 95th percentile at 24.3 L/min.  He reports ongoing difficulty tolerating AutoPap therapy.  He is a stomach sleeper and the mask does not let him sleep on his stomach.  He has worked hard on weight loss and has lost over 50 pounds since we tested his sleep apnea about a year ago.  He would be willing to get reevaluated.  He is working closely with his PCP on diabetes control and weight management.  His tremor has been less, wife reports that this has definitely improved. He is not on hydrochlorothiazide .  He does not always hydrate well with water.  Of note, his home sleep test from 08/06/2023 showed severe obstructive sleep apnea with a total AHI of 48.9/hour and O2 nadir of 84% with significant time below or at 88% saturation of over 30 minutes for the study, indicating nocturnal hypoxemia. Snoring was detected, in the mild to moderate range, at times louder.  He started home AutoPap therapy in February 2025.  He had a brain MRI without contrast on 07/29/2023 and I  reviewed the results:      IMPRESSION: MRI scan of the brain without contrast showing mild age-related changes of chronic small vessel disease.  No acute abnormality. The patient's allergies, current medications, family history, past medical history, past social history, past surgical history and problem list were reviewed and updated as appropriate.   Previously (copied from previous notes for reference):   12/23/23 Oscar Russell, NP): << Oscar Day is a 68 y.o. male with a history of obstructive sleep apnea on CPAP and tremor. Returns today for follow-up.  He reports that he CPAP is working well.  He is still trying to get adjusted to using the machine nightly.  He states that he does like his mask.  He has all to adjust the straps to keep it from leaking.  He feels that his tremor is better.  Tremor affects both hands equally according to patient.  Download for CPAP is below. >>  07/03/2023 (SA): 68 year old gentleman with an underlying medical history of coronary artery disease, with status post 5 vessel bypass, hypertension, sleep apnea, diabetes, and morbid obesity with a BMI of over 40, who presents for follow-up consultation of his hand tremors.  The patient is accompanied by his wife today.  I first met him at the request of his primary care physician on 06/06/2022, at which time he reported a 1-1/2-year history of hand tremors, intermittently.  His examination was  not in keeping with a significant tremor, we talked about tremor triggers at the time, he was reassured that there were no signs of parkinsonism.  He was advised to proceed with a brain MRI as well as a sleep study.  His laboratory attended sleep study was not approved by his insurance, he did not pursue a brain MRI or home sleep test at the time.   Today, 07/03/2023: He reports that his tremor has been worse, it is still intermittent, unfortunately, they have had increase in stress in the past year.  They lost their son earlier  in the year.  This was acute and unexpected and he had significant increase in tremor at the time.  He is working on diabetes control.  He has been on Jardiance as well as metformin and is supposed to start Ozempic.  He limits his caffeine to 1 cup of coffee in the morning.  He stopped smoking in July 2024.  He fell in September but it was on the carpet and he did not hit his head or lose consciousness, no major injuries provide and he did not seek medical attention at the time.  He does try to drink water but could do a little better by self-report.     06/06/2022 (SA): (He) reports an approximately 1-1/2-year history of hand tremors, particularly in the thumb areas bilaterally, worse on the right side per patient.  I reviewed your office note from 05/18/2022.  He had recent lab work on 05/18/2022 and I reviewed the results: Vitamin D  98, lipid panel showed total cholesterol level of 189, HDL 53, triglycerides 57, LDL 122, CMP showed glucose elevated at 130, BUN 25, creatinine 1.1, sodium 142, potassium 4.9, alk phos 84, AST 12, ALT 10, TSH normal at 0.96, CBC without differential showed elevated WBC at 13.7, hemoglobin below normal at 12, hematocrit below normal at 37.4, platelets elevated at 416, vitamin B12 on the lower end of the spectrum at 398, folate normal at 10.8, PSA normal at 0.17. He has previously seen a neurologist, at [facility] neurological care by Dr. Julious on 02/15/2022 and I reviewed the office visit note.  A MRI brain without contrast was ordered but apparently not done due to insurance approval as understand.  He was not started on any new medications at the time.  Per wife, he was not felt to have any Parkinson's disease at the time.     Of note, he is on a beta-blocker currently, metoprolol 25 mg twice daily.  He also takes Lexapro 20 mg daily.    She reports that she has noticed that the tremor tends to be worse at the end of the day, when he is fatigued, or when he has not regular  schedule.  She works 3 days a week and then he tends to not eat on time on those days.  He has had blood sugar fluctuation and latest A1c was above 7 per wife.  He does not sleep very well.  He has a prior diagnosis of sleep apnea but has not used his CPAP in many years.  He could not get comfortable with the machine.  His machine is over 19 years old.  Epworth sleepiness score is 10 out of 24 today.   He has not fallen except once in the recent past, he tripped over a pair of shoes as he was getting out of bed. Tremor affects both hands, worse on the dominant side.     He denies a family  history of tremors, mom lived to be 61, she had dementia and had tremors.  Brother has dementia and has hand tremors as well.  He has only 1 brother.  He drinks caffeine in the form of coffee, usually 1 cup/day and 2 glasses of tea per day.    His Past Medical History Is Significant For: Past Medical History:  Diagnosis Date   CAD (coronary artery disease)    Diabetes mellitus without complication (HCC)    Hypertension    Obesity    Sleep apnea     His Past Surgical History Is Significant For: Past Surgical History:  Procedure Laterality Date   CORONARY ARTERY BYPASS GRAFT  2010    His Family History Is Significant For: Family History  Problem Relation Age of Onset   Tremor Mother    CAD Brother    Tremor Brother    Sleep apnea Brother     His Social History Is Significant For: Social History   Socioeconomic History   Marital status: Married    Spouse name: Not on file   Number of children: Not on file   Years of education: Not on file   Highest education level: Not on file  Occupational History   Not on file  Tobacco Use   Smoking status: Every Day    Current packs/day: 0.50    Average packs/day: 0.5 packs/day    Types: Cigarettes    Start date: 06/2024   Smokeless tobacco: Never  Vaping Use   Vaping status: Never Used  Substance and Sexual Activity   Alcohol use: Not Currently     Comment: occ   Drug use: No   Sexual activity: Not on file  Other Topics Concern   Not on file  Social History Narrative   Lives with wife    Retired    [employer] Strain: Not on file  Food Insecurity: Not on file  Transportation Needs: Not on file  Physical Activity: Not on file  Stress: Not on file  Social Connections: Not on file    His Allergies Are:  Allergies  Allergen Reactions   Lisinopril Swelling  :   His Current Medications Are:  Outpatient Encounter Medications as of 06/30/2024  Medication Sig   acetaminophen  (TYLENOL ) 500 MG tablet Take 1 tablet (500 mg total) by mouth every 6 (six) hours as needed for mild pain or moderate pain.   amLODipine (NORVASC) 5 MG tablet Take 5 mg by mouth daily.   aspirin  325 MG tablet Take 325 mg by mouth daily.   atorvastatin  (LIPITOR) 40 MG tablet Take 40 mg by mouth.    diphenhydrAMINE  (BENADRYL  ALLERGY) 25 MG tablet Take 1 tablet (25 mg total) by mouth every 6 (six) hours as needed for itching or allergies. (Patient taking differently: Take 25 mg by mouth as needed for itching or allergies.)   EPINEPHrine  0.3 mg/0.3 mL IJ SOAJ injection Inject 0.3 mg into the muscle as needed for anaphylaxis.   escitalopram (LEXAPRO) 20 MG tablet Take 20 mg by mouth daily.   furosemide (LASIX) 40 MG tablet Take 40 mg by mouth daily.   hydrochlorothiazide  (MICROZIDE ) 12.5 MG capsule Take 1 capsule (12.5 mg total) by mouth daily.   HYDROcodone-acetaminophen  (NORCO) 7.5-325 MG tablet Take 1 tablet by mouth every 6 (six) hours as needed.   JARDIANCE 25 MG TABS tablet Take 25 mg by mouth daily.   metFORMIN (GLUCOPHAGE) 1000 MG tablet Take 1,000 mg by mouth  2 (two) times daily. (Patient taking differently: Take 500 mg by mouth 2 (two) times daily.)   metoprolol tartrate (LOPRESSOR) 25 MG tablet Take 25 mg by mouth 2 (two) times daily.   OZEMPIC, 1 MG/DOSE, 4 MG/3ML SOPN Inject 2 mg into the skin once a week.    pantoprazole  (PROTONIX ) 40 MG tablet Take 40 mg by mouth daily.    Vitamin D , Ergocalciferol , (DRISDOL) 1.25 MG (50000 UNIT) CAPS capsule Take 50,000 Units by mouth once a week.   No facility-administered encounter medications on file as of 06/30/2024.  :  Review of Systems:  Out of a complete 14 point review of systems, all are reviewed and negative with the exception of these symptoms as listed below:  Review of Systems  Objective:  Neurological Exam  Physical Exam Physical Examination:   Vitals:   06/30/24 1316  BP: 124/72  Pulse: 72    General Examination: The patient is a very pleasant 68 y.o. male in no acute distress. He appears well-developed and well-nourished and well groomed.   HEENT: Normocephalic, atraumatic, pupils are equal, round and reactive to light, extraocular tracking is well-preserved.  No obvious nystagmus.  Hearing is grossly intact.  Face is symmetric with normal facial animation.  Speech is clear without dysarthria, hypophonia or voice tremor.  No nuchal rigidity noted, no carotid bruits.  No head tremor.  Airway examination reveals moderate mouth dryness, moderate airway crowding.  Tongue protrudes centrally and palate elevates symmetrically.  Significant underbite noted.   Chest: Clear to auscultation without wheezing, rhonchi or crackles noted.   Heart: S1+S2+0, regular and normal without murmurs, rubs or gallops noted.    Abdomen: Soft, non-tender and non-distended.   Extremities: There is trace swelling in the distal lower extremities bilaterally, mainly around the ankles.     Skin: Warm and dry without trophic changes noted.    Musculoskeletal: exam reveals no obvious joint deformities.    Neurologically:  Mental status: The patient is awake, alert and oriented in all 4 spheres. His immediate and remote memory, attention, language skills and fund of knowledge are appropriate. There is no evidence of aphasia, agnosia, apraxia or anomia. Speech is  clear with normal prosody and enunciation. Thought process is linear. Mood is normal and affect is normal.  Cranial nerves II - XII are as described above under HEENT exam.  Motor exam: Normal bulk, strength and tone is noted.  No drift or rebound.  There is no obvious action tremor today.  Or postural tremor today.  No resting tremor today.   (On 06/06/2022: on Archimedes spiral drawing he has insecurity with the left hand, no trembling of the right hand or left hand, handwriting is legible, not tremulous, not micrographic.)   On 07/03/2023: On Archimedes spiral drawing he has no significant trembling with the right hand, slight trembling with the left hand, handwriting is legible, not particularly tremulous, not micrographic.   Fine motor skills and coordination: Grossly intact.     Cerebellar testing: No dysmetria or intention tremor. There is no truncal or gait ataxia.    Sensory exam: intact to light touch in the upper and lower extremities.   Gait, station and balance: He stands with mild difficulty, requires no assistance.  He walks without a walking aid.    Assessment and Plan:  In summary, Oscar Day is a 68 year old male with an underlying medical history of coronary artery disease, with status post 5 vessel bypass, tremor, hypertension, sleep apnea, diabetes, and obesity,  who presents for follow-up consultation of his obstructive sleep apnea, on AutoPap therapy.  He has had ongoing difficulty with AutoPap therapy.  He has achieved a significant amount of weight loss since he was tested in December 2024.  We mutually agreed to proceed with another home sleep test.  We talked about alternative treatment options at length today.  He is encouraged to continue to work closely with his primary care on better diabetes control and weight loss.  He is commended for his weight loss success and improvement of his diabetes control thus far.  He is also commended for trying AutoPap therapy.  We  talked about surgical options and mutually agreed to try to avoid any surgical treatment such as inspire if possible.  He is not a good candidate for an oral appliance due to his significant underbite.  We will keep him posted as to his home sleep test results by phone call for now.  In the interim, he is encouraged to continue to try using his AutoPap machine consistently.  I answered all their questions today and the patient and his wife were in agreement.   I spent 30 minutes in total face-to-face time and in reviewing records during pre-charting, more than 50% of which was spent in counseling and coordination of care, reviewing test results, reviewing medications and treatment regimen and/or in discussing or reviewing the diagnosis of OSA, the prognosis and treatment options. Pertinent laboratory and imaging test results that were available during this visit with the patient were reviewed by me and considered in my medical decision making (see chart for details).     His tremor is worse with stress and anxiety.  Unfortunately, he has had increase in stress lately.  He is currently in therapy for grief counseling per wife.  He is working on better diabetes control and weight loss.  He is working on better hydration with water.  He would like to pursue testing as discussed last year in the form of home sleep test to look into sleep apnea concern and he would be agreeable to pursuing brain MRI at this time.  We will do a chemistry panel today in preparation for the brain MRI with and without contrast.  We mutually agreed not to start any new medications at this time.  We will plan a follow-up after testing, we will keep him posted as to his test results by phone call in the interim.  We talked about the importance of maintaining a healthy lifestyle and tremor triggers again today.  I answered all the questions today and the patient and his wife were in agreement.

## 2024-09-25 ENCOUNTER — Encounter: Payer: Self-pay | Admitting: Podiatry

## 2024-09-25 ENCOUNTER — Ambulatory Visit (INDEPENDENT_AMBULATORY_CARE_PROVIDER_SITE_OTHER): Admitting: Podiatry

## 2024-09-25 DIAGNOSIS — E119 Type 2 diabetes mellitus without complications: Secondary | ICD-10-CM | POA: Diagnosis not present

## 2024-09-25 DIAGNOSIS — B351 Tinea unguium: Secondary | ICD-10-CM

## 2024-09-25 DIAGNOSIS — M79675 Pain in left toe(s): Secondary | ICD-10-CM | POA: Diagnosis not present

## 2024-09-25 DIAGNOSIS — M79674 Pain in right toe(s): Secondary | ICD-10-CM

## 2024-09-25 NOTE — Progress Notes (Signed)
 This patient returns to my office for at risk foot care.  This patient requires this care by a professional since this patient will be at risk due to taking metformin.  This patient is unable to cut nails himself since the patient cannot reach his nails.These nails are painful walking and wearing shoes.  He presents to the office with his wife.This patient presents for at risk foot care today.  General Appearance  Alert, conversant and in no acute stress.  Vascular  Dorsalis pedis and posterior tibial  pulses are  weakly palpable  bilaterally.  Capillary return is within normal limits  bilaterally. Temperature is within normal limits  bilaterally.  Neurologic  Senn-Weinstein monofilament wire test within normal limits  bilaterally. Muscle power within normal limits bilaterally.  Nails Thick disfigured discolored nails with subungual debris  from hallux to fifth toes bilaterally. No evidence of bacterial infection or drainage bilaterally.  Orthopedic  No limitations of motion  feet .  No crepitus or effusions noted.  No bony pathology or digital deformities noted.  Skin  normotropic skin with no porokeratosis noted bilaterally.  No signs of infections or ulcers noted.   Dry scaly skin.  Onychomycosis  Pain in right toes  Pain in left toes  Consent was obtained for treatment procedures.   Mechanical debridement of nails 1-5  bilaterally performed with a nail nipper.  Filed with dremel without incident. Apply moisturiser   Return office visit    3 months                  Told patient to return for periodic foot care and evaluation due to potential at risk complications.   Cordella Bold DPM

## 2024-12-24 ENCOUNTER — Ambulatory Visit: Admitting: Podiatry
